# Patient Record
Sex: Male | Born: 1970 | Race: Black or African American | Hispanic: No | Marital: Single | Smoking: Former smoker
Health system: Southern US, Community
[De-identification: ages and names within clinical notes are randomized; demographics above are authoritative.]

## PROBLEM LIST (undated history)

## (undated) DIAGNOSIS — C61 Malignant neoplasm of prostate: Secondary | ICD-10-CM

## (undated) DIAGNOSIS — Z21 Asymptomatic human immunodeficiency virus [HIV] infection status: Secondary | ICD-10-CM

## (undated) DIAGNOSIS — E785 Hyperlipidemia, unspecified: Secondary | ICD-10-CM

## (undated) DIAGNOSIS — B20 Human immunodeficiency virus [HIV] disease: Secondary | ICD-10-CM

## (undated) DIAGNOSIS — I10 Essential (primary) hypertension: Secondary | ICD-10-CM

## (undated) HISTORY — DX: Hyperlipidemia, unspecified: E78.5

## (undated) HISTORY — DX: Asymptomatic human immunodeficiency virus (hiv) infection status: Z21

## (undated) HISTORY — DX: Human immunodeficiency virus (HIV) disease: B20

## (undated) HISTORY — DX: Malignant neoplasm of prostate: C61

## (undated) HISTORY — DX: Essential (primary) hypertension: I10

## (undated) HISTORY — PX: NO PAST SURGERIES: SHX2092

---

## 2014-06-17 DIAGNOSIS — R7612 Nonspecific reaction to cell mediated immunity measurement of gamma interferon antigen response without active tuberculosis: Secondary | ICD-10-CM | POA: Insufficient documentation

## 2014-06-17 DIAGNOSIS — Z227 Latent tuberculosis: Secondary | ICD-10-CM

## 2014-06-17 HISTORY — DX: Latent tuberculosis: Z22.7

## 2014-12-14 DIAGNOSIS — E782 Mixed hyperlipidemia: Secondary | ICD-10-CM | POA: Insufficient documentation

## 2014-12-14 DIAGNOSIS — C61 Malignant neoplasm of prostate: Secondary | ICD-10-CM | POA: Insufficient documentation

## 2014-12-14 DIAGNOSIS — Z8042 Family history of malignant neoplasm of prostate: Secondary | ICD-10-CM | POA: Insufficient documentation

## 2014-12-14 DIAGNOSIS — K219 Gastro-esophageal reflux disease without esophagitis: Secondary | ICD-10-CM | POA: Insufficient documentation

## 2016-08-30 DIAGNOSIS — Z79899 Other long term (current) drug therapy: Secondary | ICD-10-CM | POA: Insufficient documentation

## 2016-08-31 DIAGNOSIS — Z23 Encounter for immunization: Secondary | ICD-10-CM | POA: Insufficient documentation

## 2017-05-27 DIAGNOSIS — Z566 Other physical and mental strain related to work: Secondary | ICD-10-CM | POA: Insufficient documentation

## 2017-05-27 DIAGNOSIS — E663 Overweight: Secondary | ICD-10-CM | POA: Insufficient documentation

## 2017-05-27 DIAGNOSIS — K029 Dental caries, unspecified: Secondary | ICD-10-CM | POA: Insufficient documentation

## 2019-06-16 DIAGNOSIS — Z21 Asymptomatic human immunodeficiency virus [HIV] infection status: Secondary | ICD-10-CM | POA: Insufficient documentation

## 2019-06-16 DIAGNOSIS — B20 Human immunodeficiency virus [HIV] disease: Secondary | ICD-10-CM | POA: Insufficient documentation

## 2019-06-16 DIAGNOSIS — I10 Essential (primary) hypertension: Secondary | ICD-10-CM | POA: Insufficient documentation

## 2020-03-07 ENCOUNTER — Telehealth: Payer: Self-pay | Admitting: *Deleted

## 2020-03-07 NOTE — Telephone Encounter (Signed)
Referring provider calling about referral status. RN relayed that we have attempted to contact patient to schedule, left messages. Tye [place] will call patient today and give him our number so he can call back to schedule TRANSFER B20 appointment.   Landis Gandy, RN

## 2020-07-06 ENCOUNTER — Other Ambulatory Visit (HOSPITAL_COMMUNITY): Payer: Self-pay

## 2020-07-06 ENCOUNTER — Telehealth: Payer: Self-pay

## 2020-07-06 NOTE — Telephone Encounter (Signed)
[facility] Patient Teacher, English as a foreign language completed.    The patient is insured through [employer]administrative officer) and has a $1540.00 copay.   Patient will need a Copay coupon card to make his copay $0.00.     We will continue to follow to see if copay assistance is needed.  Ileene Patrick, [facility] Patient [facility] Phone: [phone] Fax:  [phone]

## 2020-07-07 ENCOUNTER — Other Ambulatory Visit: Payer: Self-pay

## 2020-07-07 DIAGNOSIS — Z79899 Other long term (current) drug therapy: Secondary | ICD-10-CM

## 2020-07-07 DIAGNOSIS — Z113 Encounter for screening for infections with a predominantly sexual mode of transmission: Secondary | ICD-10-CM

## 2020-07-07 DIAGNOSIS — B20 Human immunodeficiency virus [HIV] disease: Secondary | ICD-10-CM

## 2020-07-11 ENCOUNTER — Other Ambulatory Visit: Payer: No Typology Code available for payment source

## 2020-07-11 ENCOUNTER — Ambulatory Visit: Payer: No Typology Code available for payment source

## 2020-07-11 ENCOUNTER — Other Ambulatory Visit: Payer: Self-pay

## 2020-07-11 ENCOUNTER — Other Ambulatory Visit (HOSPITAL_COMMUNITY)
Admission: RE | Admit: 2020-07-11 | Discharge: 2020-07-11 | Disposition: A | Discharge status: Home / Self Care | Payer: No Typology Code available for payment source | Source: Ambulatory Visit | Attending: Infectious Diseases | Admitting: Infectious Diseases

## 2020-07-11 DIAGNOSIS — B20 Human immunodeficiency virus [HIV] disease: Secondary | ICD-10-CM | POA: Insufficient documentation

## 2020-07-11 DIAGNOSIS — Z79899 Other long term (current) drug therapy: Secondary | ICD-10-CM

## 2020-07-11 DIAGNOSIS — Z113 Encounter for screening for infections with a predominantly sexual mode of transmission: Secondary | ICD-10-CM | POA: Insufficient documentation

## 2020-07-12 LAB — URINALYSIS
Bilirubin Urine: NEGATIVE
Glucose, UA: NEGATIVE
Hgb urine dipstick: NEGATIVE
Ketones, ur: NEGATIVE
Leukocytes,Ua: NEGATIVE
Nitrite: NEGATIVE
Protein, ur: NEGATIVE
Specific Gravity, Urine: 1.023 (ref 1.001–1.035)
pH: 7 (ref 5.0–8.0)

## 2020-07-12 LAB — URINE CYTOLOGY ANCILLARY ONLY
Chlamydia: NEGATIVE
Comment: NEGATIVE
Comment: NORMAL
Neisseria Gonorrhea: NEGATIVE

## 2020-07-12 LAB — T-HELPER CELL (CD4) - (RCID CLINIC ONLY)
CD4 % Helper T Cell: 50 % (ref 33–65)
CD4 T Cell Abs: 702 /uL (ref 400–1790)

## 2020-07-13 NOTE — Progress Notes (Signed)
Quantiferon +  Previously in care at [facility], will look through previous records to see if he has had tx for this in the past. Will discuss at upcoming [place] in May

## 2020-07-25 ENCOUNTER — Ambulatory Visit
Admission: RE | Admit: 2020-07-25 | Discharge: 2020-07-25 | Disposition: A | Discharge status: Home / Self Care | Payer: No Typology Code available for payment source | Source: Ambulatory Visit | Attending: Infectious Diseases | Admitting: Infectious Diseases

## 2020-07-25 ENCOUNTER — Ambulatory Visit: Payer: Self-pay | Admitting: Pharmacist

## 2020-07-25 ENCOUNTER — Encounter: Payer: Self-pay | Admitting: Infectious Diseases

## 2020-07-25 ENCOUNTER — Other Ambulatory Visit: Payer: Self-pay

## 2020-07-25 ENCOUNTER — Ambulatory Visit: Payer: No Typology Code available for payment source | Admitting: Infectious Diseases

## 2020-07-25 VITALS — BP 134/84 | HR 76 | Wt 209.0 lb

## 2020-07-25 DIAGNOSIS — B20 Human immunodeficiency virus [HIV] disease: Secondary | ICD-10-CM | POA: Diagnosis not present

## 2020-07-25 DIAGNOSIS — C61 Malignant neoplasm of prostate: Secondary | ICD-10-CM | POA: Diagnosis not present

## 2020-07-25 DIAGNOSIS — R7612 Nonspecific reaction to cell mediated immunity measurement of gamma interferon antigen response without active tuberculosis: Secondary | ICD-10-CM

## 2020-07-25 DIAGNOSIS — A159 Respiratory tuberculosis unspecified: Secondary | ICD-10-CM

## 2020-07-25 MED ORDER — GENVOYA 150-150-200-10 MG PO TABS: ORAL_TABLET | ORAL | 5 refills | Status: DC

## 2020-07-25 NOTE — Patient Instructions (Signed)
Please stop by [facility] on your way out for a quick chest x ray. No appointment needed. If you cannot today we can do this at another visit.   Please send [place] a photo of your COVID vaccine card so we can update your chart.   Will see you back in 6 months.   We can do blood work prior to your visit or at the visit same day - depends on what you prefer.

## 2020-07-25 NOTE — Progress Notes (Signed)
Name: Jared [name]  DOB: 1970-10-20 MRN: [id number] PCP: Doreatha Lew, MD      Brief Narrative:  Jared [name] is a 50 y.o. male with well controlled HIV disease, Dx in [place] in 2007.  CD4 nadir > 200.  HIV Risk: sexual (MSM/heterosexual) History of OIs: none known Intake Labs 06/2020: Hep B sAg (-), sAb (+), cAb (-); Hep A (+), Hep C (-) Quantiferon (+ --> s/p LTBI tx 2016 28m isoniazid) HLA B*5701 (-) G6PD: () Toxo IgG 2016: (-)   Previous Regimens: . 2007 - Norvir, Epzicom (two other medication with this but cannot recall) . Stribild  . genvoya    Genotypes: . 2016: "on file with [facility] but not available to see"  Subjective:   Chief Complaint  Patient presents with  . New Patient (Initial Visit)    Declined condoms;      HPI: Jared [name] is a 50 y.o. male is here today for his first visit to transfer care for HIV disease.   Previously in care with [facility] (On Genvoya, last office visit appears to have been Feb 2021 with undetectable viral load at that time and CD4 596). He states he was diagnosed around 2007 and had a very unpleasant pill regimen at first. Switched to [place] a few years ago and states he has very good adherence with this. Misses rare doses if something comes up unexpectedly but would estimate this < 1x monthly if that. No trouble with side effects or access to medication. Would like to be a father someday. No current partners, enjoys male and male historically. Has a PCP through [facility] he works with for other medical conditions. Working with [facility] re: adenocarcinoma of prostate. Eating and sleeping well. No significant concerns over anxious or depressed mood.     Review of Systems  Constitutional: Negative for chills, fever, malaise/fatigue and weight loss.  HENT: Negative for sore throat.        No dental problems  Respiratory: Negative for cough and sputum production.   Cardiovascular: Negative  for chest pain and leg swelling.  Gastrointestinal: Negative for abdominal pain, diarrhea and vomiting.  Genitourinary: Negative for dysuria and flank pain.  Musculoskeletal: Negative for joint pain, myalgias and neck pain.  Skin: Negative for rash.  Neurological: Negative for dizziness, tingling and headaches.  Psychiatric/Behavioral: Negative for depression and substance abuse. The patient is not nervous/anxious and does not have insomnia.     Past Medical History:  Diagnosis Date  . HIV infection ([place])   . Hyperlipidemia   . Hypertension   . 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 Received daily INH for 8.5 months Formatting of this note might be different from the original. Received daily INH for 8.5 months Received daily INH for 8.5 months Formatting of this note might be different from the original. Received daily INH for 8.5 months  . Prostate cancer [facility])     Outpatient Medications Prior to Visit  Medication Sig Dispense Refill  . amLODipine (NORVASC) 5 MG tablet Take 1 tablet by mouth daily.    Marland Kitchen ascorbic acid (VITAMIN C) 1000 MG tablet Vitamin C    . Multiple Vitamin (MULTI-VITAMIN) tablet Take by mouth.    Marland Kitchen omeprazole (PRILOSEC) 20 MG capsule omeprazole 20 mg capsule,delayed release  TAKE 1 CAPSULE BY MOUTH EVERY DAY    . valACYclovir (VALTREX) 500 MG tablet Take 500 mg by mouth 2 (  two) times daily.    Marland Kitchen elvitegravir-cobicistat-emtricitabine-tenofovir (GENVOYA) 150-150-200-10 MG TABS tablet TAKE ONE TABLET BY MOUTH ONCE DAILY WITH FOOD. STORE IN ORIGINAL CONTAINER AT ROOM TEMPERATURE.     No facility-administered medications prior to visit.     Allergies  Allergen Reactions  . No Known Allergies     Social History   Tobacco Use  . Smoking status: Former Research scientist (life sciences)  . Smokeless tobacco: Never Used  . Tobacco comment: passive, when a kid, not habitual  Substance Use Topics  . Alcohol use: Not Currently   . Drug use: Not Currently    Family History  Problem Relation Age of Onset  . Heart failure Mother        amyloidosis   . Diabetes Mother   . Hypertension Mother   . Diabetes Father   . Other Father        cardiac arrest   . Heart failure Maternal Grandmother        pacemaker   . Melanoma Maternal Grandfather   . Diabetes Paternal Grandmother   . Heart disease Paternal Grandmother     Social History   Substance and Sexual Activity  Sexual Activity Not Currently  . Partners: Male, Male     Objective:   Vitals:   07/25/20 1357  BP: 134/84  Pulse: 76  Weight: 209 lb (94.8 kg)   There is no height or weight on file to calculate BMI.   Physical Exam Vitals reviewed.  Constitutional:      Appearance: Normal appearance. He is not ill-appearing.  HENT:     Nose: No congestion.  Eyes:     General: No scleral icterus.    Pupils: Pupils are equal, round, and reactive to light.  Cardiovascular:     Rate and Rhythm: Normal rate and regular rhythm.     Heart sounds: No murmur heard.   Pulmonary:     Effort: Pulmonary effort is normal.     Breath sounds: Normal breath sounds.  Abdominal:     General: There is no distension.     Palpations: Abdomen is soft.  Musculoskeletal:        General: Normal range of motion.     Cervical back: Normal range of motion.  Skin:    General: Skin is warm and dry.     Capillary Refill: Capillary refill takes less than 2 seconds.  Neurological:     Mental Status: He is alert and oriented to person, place, and time.     Lab Results Lab Results  Component Value Date   WBC 4.4 07/11/2020   HGB 14.9 07/11/2020   HCT 44.7 07/11/2020   MCV 88.2 07/11/2020   PLT 249 07/11/2020    Lab Results  Component Value Date   CREATININE 1.30 07/11/2020   BUN 11 07/11/2020   NA 138 07/11/2020   K 4.2 07/11/2020   CL 103 07/11/2020   CO2 30 07/11/2020    Lab Results  Component Value Date   ALT 11 07/11/2020   AST 14 07/11/2020    BILITOT 0.5 07/11/2020    Lab Results  Component Value Date   CHOL 153 07/11/2020   HDL 58 07/11/2020   LDLCALC 76 07/11/2020   TRIG 105 07/11/2020   CHOLHDL 2.6 07/11/2020   HIV 1 RNA Quant (copies/mL)  Date Value  07/11/2020 NOT DETECTED   CD4 T Cell Abs (/uL)  Date Value  07/11/2020 702     Assessment & Plan:   Problem List  Items Addressed This Visit      Unprioritized   Human immunodeficiency virus (HIV) disease ([place]) - Primary    New patient here to establish for HIV care.   [facility] in care with [facility] on Genvoya once daily with well-controlled HIV and healthy recovery of CD4 > 500 cells. His adherence is very good and there are no drug interactions identified that are significant. Will provide refills for him today. 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   I discussed with Bobetta Lime treatment options/side effects, benefits of treatment and long-term outcomes. I discussed how HIV is transmitted and the process of untreated HIV including increased risk for opportunistic infections, cancer, dementia and renal failure. Patient was counseled on routine HIV care including medication adherence, blood monitoring, necessary vaccines and follow up visits. Counseled regarding safe sex practices including: condom use, partner disclosure, limiting partners.  General introduction to our clinic and integrated services.  I spent greater than 45 minutes with the patient today. Greater than 50% of the time spent face-to-face counseling and coordination of care re: HIV and health maintenance.        Relevant Medications   valACYclovir (VALTREX) 500 MG tablet   elvitegravir-cobicistat-emtricitabine-tenofovir (GENVOYA) 150-150-200-10 MG TABS tablet   Other Relevant Orders   HIV-1 RNA quant-no reflex-bld   T-helper cell (CD4)- ([facility] clinic only)    History of Latent TB, s/p treatment 2016    S/P recommended treatment with Isoniazid in 2016. Will order baseline chest xray. Discussed he needs no further treatment and risk for reactivation of disease is extremely low given his treatment regimen.       Relevant Orders   DG Chest 2 View   Adenocarcinoma of prostate [facility])    Currently working with [facility]       Relevant Medications   valACYclovir (VALTREX) 500 MG tablet   elvitegravir-cobicistat-emtricitabine-tenofovir (GENVOYA) 150-150-200-10 MG TABS tablet      Janene Madeira, MSN, NP-C [facility] [place] Pager: [phone] Office: [phone]  07/26/20  9:42 AM

## 2020-07-26 ENCOUNTER — Encounter: Payer: Self-pay | Admitting: Infectious Diseases

## 2020-07-26 NOTE — Assessment & Plan Note (Signed)
New patient here to establish for HIV care.   Previously in care with [facility] on Genvoya once daily with well-controlled HIV and healthy recovery of CD4 > 500 cells. His adherence is very good and there are no drug interactions identified that are significant. Will provide refills for him today. 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   I discussed with Bobetta Lime treatment options/side effects, benefits of treatment and long-term outcomes. I discussed how HIV is transmitted and the process of untreated HIV including increased risk for opportunistic infections, cancer, dementia and renal failure. Patient was counseled on routine HIV care including medication adherence, blood monitoring, necessary vaccines and follow up visits. Counseled regarding safe sex practices including: condom use, partner disclosure, limiting partners.  General introduction to our clinic and integrated services.  I spent greater than 45 minutes with the patient today. Greater than 50% of the time spent face-to-face counseling and coordination of care re: HIV and health maintenance.

## 2020-07-26 NOTE — Assessment & Plan Note (Signed)
S/P recommended treatment with Isoniazid in 2016. Will order baseline chest xray. Discussed he needs no further treatment and risk for reactivation of disease is extremely low given his treatment regimen.

## 2020-07-26 NOTE — Assessment & Plan Note (Signed)
Currently working with [facility]

## 2020-07-28 LAB — HEPATITIS A ANTIBODY, TOTAL: Hepatitis A AB,Total: REACTIVE — AB

## 2020-07-28 LAB — CBC WITH DIFFERENTIAL/PLATELET
Absolute Monocytes: 585 cells/uL (ref 200–950)
Basophils Absolute: 31 cells/uL (ref 0–200)
Basophils Relative: 0.7 %
Eosinophils Absolute: 92 cells/uL (ref 15–500)
Eosinophils Relative: 2.1 %
HCT: 44.7 % (ref 38.5–50.0)
Hemoglobin: 14.9 g/dL (ref 13.2–17.1)
Lymphs Abs: 1373 cells/uL (ref 850–3900)
MCH: 29.4 pg (ref 27.0–33.0)
MCHC: 33.3 g/dL (ref 32.0–36.0)
MCV: 88.2 fL (ref 80.0–100.0)
MPV: 11.4 fL (ref 7.5–12.5)
Monocytes Relative: 13.3 %
Neutro Abs: 2319 cells/uL (ref 1500–7800)
Neutrophils Relative %: 52.7 %
Platelets: 249 10*3/uL (ref 140–400)
RBC: 5.07 10*6/uL (ref 4.20–5.80)
RDW: 13.8 % (ref 11.0–15.0)
Total Lymphocyte: 31.2 %
WBC: 4.4 10*3/uL (ref 3.8–10.8)

## 2020-07-28 LAB — HEPATITIS B CORE ANTIBODY, TOTAL: Hep B Core Total Ab: NONREACTIVE

## 2020-07-28 LAB — COMPLETE METABOLIC PANEL WITH GFR
AG Ratio: 1.5 (calc) (ref 1.0–2.5)
ALT: 11 U/L (ref 9–46)
AST: 14 U/L (ref 10–35)
Albumin: 4.2 g/dL (ref 3.6–5.1)
Alkaline phosphatase (APISO): 62 U/L (ref 35–144)
BUN: 11 mg/dL (ref 7–25)
CO2: 30 mmol/L (ref 20–32)
Calcium: 9.1 mg/dL (ref 8.6–10.3)
Chloride: 103 mmol/L (ref 98–110)
Creat: 1.3 mg/dL (ref 0.70–1.33)
GFR, Est African American: 73 mL/min/{1.73_m2} (ref >=60)
GFR, Est Non African American: 63 mL/min/{1.73_m2} (ref >=60)
Globulin: 2.8 g/dL (calc) (ref 1.9–3.7)
Glucose, Bld: 95 mg/dL (ref 65–99)
Potassium: 4.2 mmol/L (ref 3.5–5.3)
Sodium: 138 mmol/L (ref 135–146)
Total Bilirubin: 0.5 mg/dL (ref 0.2–1.2)
Total Protein: 7 g/dL (ref 6.1–8.1)

## 2020-07-28 LAB — HEPATITIS B SURFACE ANTIBODY,QUALITATIVE: Hep B S Ab: BORDERLINE — AB

## 2020-07-28 LAB — HIV-1 RNA ULTRAQUANT REFLEX TO GENTYP+
HIV 1 RNA Quant: NOT DETECTED copies/mL
HIV-1 RNA Quant, Log: NOT DETECTED Log copies/mL

## 2020-07-28 LAB — HIV-1/2 AB - DIFFERENTIATION
HIV-1 antibody: POSITIVE — AB
HIV-2 Ab: NEGATIVE

## 2020-07-28 LAB — RPR: RPR Ser Ql: NONREACTIVE

## 2020-07-28 LAB — LIPID PANEL
Cholesterol: 153 mg/dL (ref <200)
HDL: 58 mg/dL (ref >=40)
LDL Cholesterol (Calc): 76 mg/dL (calc)
Non-HDL Cholesterol (Calc): 95 mg/dL (calc) (ref <130)
Total CHOL/HDL Ratio: 2.6 (calc) (ref <5.0)
Triglycerides: 105 mg/dL (ref <150)

## 2020-07-28 LAB — HEPATITIS C ANTIBODY
Hepatitis C Ab: NONREACTIVE
SIGNAL TO CUT-OFF: 0.01 (ref <1.00)

## 2020-07-28 LAB — QUANTIFERON-TB GOLD PLUS
Mitogen-NIL: >10 IU/mL
NIL: 0.19 IU/mL
QuantiFERON-TB Gold Plus: POSITIVE — AB
TB1-NIL: 1.26 IU/mL
TB2-NIL: 1.5 IU/mL

## 2020-07-28 LAB — HLA B*5701: HLA-B*5701 w/rflx HLA-B High: NEGATIVE

## 2020-07-28 LAB — HIV ANTIBODY (ROUTINE TESTING W REFLEX): HIV 1&2 Ab, 4th Generation: REACTIVE — AB

## 2020-07-28 LAB — HEPATITIS B SURFACE ANTIGEN: Hepatitis B Surface Ag: NONREACTIVE

## 2020-08-24 ENCOUNTER — Encounter: Payer: Self-pay | Admitting: Infectious Diseases

## 2021-01-16 ENCOUNTER — Other Ambulatory Visit: Payer: No Typology Code available for payment source

## 2021-01-30 ENCOUNTER — Encounter: Payer: No Typology Code available for payment source | Admitting: Infectious Diseases

## 2021-02-13 ENCOUNTER — Other Ambulatory Visit: Payer: No Typology Code available for payment source

## 2021-02-13 ENCOUNTER — Other Ambulatory Visit: Payer: Self-pay

## 2021-02-13 DIAGNOSIS — B20 Human immunodeficiency virus [HIV] disease: Secondary | ICD-10-CM

## 2021-02-14 LAB — T-HELPER CELL (CD4) - (RCID CLINIC ONLY)
CD4 % Helper T Cell: 43 % (ref 33–65)
CD4 T Cell Abs: 547 /uL (ref 400–1790)

## 2021-02-15 LAB — HIV-1 RNA QUANT-NO REFLEX-BLD
HIV 1 RNA Quant: NOT DETECTED Copies/mL
HIV-1 RNA Quant, Log: NOT DETECTED Log cps/mL

## 2021-02-24 ENCOUNTER — Other Ambulatory Visit: Payer: Self-pay | Admitting: Infectious Diseases

## 2021-02-24 DIAGNOSIS — B20 Human immunodeficiency virus [HIV] disease: Secondary | ICD-10-CM

## 2021-02-24 NOTE — Telephone Encounter (Signed)
Appointment 12/12

## 2021-02-27 ENCOUNTER — Ambulatory Visit (INDEPENDENT_AMBULATORY_CARE_PROVIDER_SITE_OTHER): Payer: No Typology Code available for payment source

## 2021-02-27 ENCOUNTER — Encounter: Payer: Self-pay | Admitting: Infectious Diseases

## 2021-02-27 ENCOUNTER — Ambulatory Visit: Payer: No Typology Code available for payment source | Admitting: Infectious Diseases

## 2021-02-27 ENCOUNTER — Other Ambulatory Visit: Payer: Self-pay

## 2021-02-27 VITALS — BP 115/79 | HR 75 | Temp 98.6°F | Wt 214.0 lb

## 2021-02-27 DIAGNOSIS — Z113 Encounter for screening for infections with a predominantly sexual mode of transmission: Secondary | ICD-10-CM

## 2021-02-27 DIAGNOSIS — Z79899 Other long term (current) drug therapy: Secondary | ICD-10-CM | POA: Diagnosis not present

## 2021-02-27 DIAGNOSIS — Z23 Encounter for immunization: Secondary | ICD-10-CM

## 2021-02-27 DIAGNOSIS — B20 Human immunodeficiency virus [HIV] disease: Secondary | ICD-10-CM | POA: Diagnosis not present

## 2021-02-27 DIAGNOSIS — B001 Herpesviral vesicular dermatitis: Secondary | ICD-10-CM

## 2021-02-27 MED ORDER — GENVOYA 150-150-200-10 MG PO TABS: ORAL_TABLET | ORAL | 11 refills | Status: DC

## 2021-02-27 NOTE — Patient Instructions (Addendum)
For the valtrex do 2 tabs (1 gram) twice a day for 1-2 days if you catch it early - your immune system should be strong enough for this to be effective. If you need to continue for 3-5 days that's fine too.  Let me know if you need any refills via mychart  I sent in refills of your Genvoya. Please continue this once a day.   Will plan to see you back in 1 year with labs prior to your visit please.

## 2021-02-27 NOTE — Progress Notes (Signed)
   Covid-19 Vaccination Clinic  Name:  Jared [name]    MRN: [id number] DOB: [birth year]  02/27/2021  [name] was observed post Covid-19 immunization for 15 minutes without incident. He was provided with Vaccine Information Sheet and instruction to access the V-Safe system.   [name] was instructed to call 911 with any severe reactions post vaccine: Difficulty breathing  Swelling of face and throat  A fast heartbeat  A bad rash all over body  Dizziness and weakness   Immunizations Administered     Name Date Dose VIS Date Route   Pfizer Covid-19 Vaccine Bivalent Booster 02/27/2021  4:26 PM 0.3 mL 11/16/2020 Intramuscular   Manufacturer: [place]   Lot: [id number]   [place]: [place], [place]

## 2021-02-27 NOTE — Progress Notes (Signed)
Name: Jared [name]  DOB: 1970-04-12 MRN: [id number] PCP: Doreatha Lew, MD      Brief Narrative:  Jared [name] is a 50 y.o. male with well controlled HIV disease, Dx in [place] in 2007.  CD4 nadir > 200.  HIV Risk: sexual (MSM/heterosexual) History of OIs: none known Intake Labs 06/2020: Hep B sAg (-), sAb (+), cAb (-); Hep A (+), Hep C (-) Quantiferon (+ --> s/p LTBI tx 2016 71misoniazid) HLA B*5701 (-) G6PD: () Toxo IgG 2016: (-)   Previous Regimens: 2007 - Norvir, Epzicom (two other medication with this but cannot recall) Stribild  genvoya    Genotypes: 2016: "on file with [facility] but not available to see"  Subjective:   Chief Complaint  Patient presents with   Follow-up     HPI: Jared BASTOSis a 50y.o. male is here for 625mU for well controlled HIV.   Underwent removal of lipoma to the scalp in August - path benign.  He has no concerns today. Taking all medications as prescribed and never misses Genvoya. Takes valtrex PRN for cold sores.    Would like Flu and COVID bivalent booster today.   He is eating and sleeping well. No changes or concerns with anxious/depressed mood. Weight stable. No new health conditions. No hospitalizations or ER visits.    Review of Systems  Constitutional:  Negative for chills, fever, malaise/fatigue and weight loss.  HENT:  Negative for sore throat.        No dental problems  Respiratory:  Negative for cough and sputum production.   Cardiovascular:  Negative for chest pain and leg swelling.  Gastrointestinal:  Negative for abdominal pain, diarrhea and vomiting.  Genitourinary:  Negative for dysuria and flank pain.  Musculoskeletal:  Negative for joint pain, myalgias and neck pain.  Skin:  Negative for rash.  Neurological:  Negative for dizziness, tingling and headaches.  Psychiatric/Behavioral:  Negative for depression and substance abuse. The patient is not nervous/anxious and does not have  insomnia.    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 Received daily INH for 8.5 months Formatting of this note might be different from the original. Received daily INH for 8.5 months Received daily INH for 8.5 months Formatting of this note might be different from the original. Received daily INH for 8.5 months   [facility]    Outpatient Medications Prior to Visit  Medication Sig Dispense Refill   amLODipine (NORVASC) 5 MG tablet Take 1 tablet by mouth daily.     ascorbic acid (VITAMIN C) 1000 MG tablet Vitamin C     Multiple Vitamin (MULTI-VITAMIN) tablet Take by mouth.     omeprazole (PRILOSEC) 20 MG capsule omeprazole 20 mg capsule,delayed release  TAKE 1 CAPSULE BY MOUTH EVERY DAY     valACYclovir (VALTREX) 500 MG tablet Take 500 mg by mouth 2 (two) times daily.     elvitegravir-cobicistat-emtricitabine-tenofovir (GENVOYA) 150-150-200-10 MG TABS tablet TAKE ONE TABLET BY MOUTH ONCE DAILY WITH FOOD. STORE IN ORIGINAL CONTAINER AT ROOM TEMPERATURE. 30 tablet 5   No facility-administered medications prior to visit.     Allergies  Allergen Reactions   No Known Allergies     Social History   Tobacco Use   Smoking status: Former   Smokeless tobacco:  Never   Tobacco comments:    passive, when a kid, not habitual  Substance Use Topics   Alcohol use: Not Currently   Drug use: Not Currently    Family History  Problem Relation Age of Onset   Heart failure Mother        amyloidosis    Diabetes Mother    Hypertension Mother    Diabetes Father    Other Father        cardiac arrest    Heart failure Maternal Grandmother        pacemaker    Melanoma Maternal Grandfather    Diabetes Paternal Grandmother    Heart disease Paternal Grandmother     Social History   Substance and Sexual Activity  Sexual  Activity Not Currently   Partners: Male, Male     Objective:   Vitals:   02/27/21 1556  BP: 115/79  Pulse: 75  Temp: 98.6 F (37 C)  TempSrc: Oral  SpO2: 97%  Weight: 214 lb (97.1 kg)   There is no height or weight on file to calculate BMI.   Physical Exam Vitals reviewed.  Constitutional:      Appearance: Normal appearance. He is not ill-appearing.  HENT:     Nose: No congestion.  Eyes:     General: No scleral icterus.    Pupils: Pupils are equal, round, and reactive to light.  Cardiovascular:     Rate and Rhythm: Normal rate and regular rhythm.     Heart sounds: No murmur heard. Pulmonary:     Effort: Pulmonary effort is normal.     Breath sounds: Normal breath sounds.  Abdominal:     General: There is no distension.     Palpations: Abdomen is soft.  Musculoskeletal:        General: Normal range of motion.     Cervical back: Normal range of motion.  Skin:    General: Skin is warm and dry.     Capillary Refill: Capillary refill takes less than 2 seconds.  Neurological:     Mental Status: He is alert and oriented to person, place, and time.    Lab Results Lab Results  Component Value Date   WBC 4.4 07/11/2020   HGB 14.9 07/11/2020   HCT 44.7 07/11/2020   MCV 88.2 07/11/2020   PLT 249 07/11/2020    Lab Results  Component Value Date   CREATININE 1.30 07/11/2020   BUN 11 07/11/2020   NA 138 07/11/2020   K 4.2 07/11/2020   CL 103 07/11/2020   CO2 30 07/11/2020    Lab Results  Component Value Date   ALT 11 07/11/2020   AST 14 07/11/2020   BILITOT 0.5 07/11/2020    Lab Results  Component Value Date   CHOL 153 07/11/2020   HDL 58 07/11/2020   LDLCALC 76 07/11/2020   TRIG 105 07/11/2020   CHOLHDL 2.6 07/11/2020   HIV 1 RNA Quant  Date Value  02/13/2021 Not Detected Copies/mL  07/11/2020 NOT DETECTED copies/mL   CD4 T Cell Abs (/uL)  Date Value  02/13/2021 547  07/11/2020 702     Assessment & Plan:   Problem List Items Addressed  This Visit       Unprioritized   Encounter for long-term (current) use of medications - Primary   Relevant Orders   Lipid panel   Human immunodeficiency virus (HIV) disease ([place])    Well controlled on Genvoya. No DDI's identified. Will provide refills, update pertinent  labs and have him return in 1 year.  Vaccines updated with flu and COVID bivalent.  No dental or mental health needs.  STI screening politely declined.  Has a PCP he sees regularly for health maintenance and prevention.       Relevant Medications   elvitegravir-cobicistat-emtricitabine-tenofovir (GENVOYA) 150-150-200-10 MG TABS tablet   Other Relevant Orders   CBC   Comprehensive metabolic panel   T-helper cell (CD4)- ([facility] clinic only)   Lipid panel   HIV-1 RNA quant-no reflex-bld   RPR   Recurrent cold sores    His immune system is strong enough to handle typical dose intervention. Valtrex 1g BID x 2 days if caught early. Otherwise same dose 3-5 days. Can have refills when he needs them.       Relevant Medications   elvitegravir-cobicistat-emtricitabine-tenofovir (GENVOYA) 150-150-200-10 MG TABS tablet   Other Visit Diagnoses     Screening examination for venereal disease       Relevant Orders   RPR   Need for immunization against influenza       Relevant Orders   Flu Vaccine QUAD 53moIM (Fluarix, Fluzone & Alfiuria Quad PF) (Completed)      SJanene Madeira MSN, NP-C [facility] DRock HillPager: [phone]: [phone] 03/05/21  8:31 PM

## 2021-03-05 ENCOUNTER — Encounter: Payer: Self-pay | Admitting: Infectious Diseases

## 2021-03-05 DIAGNOSIS — B001 Herpesviral vesicular dermatitis: Secondary | ICD-10-CM | POA: Insufficient documentation

## 2021-03-05 NOTE — Assessment & Plan Note (Signed)
His immune system is strong enough to handle typical dose intervention. Valtrex 1g BID x 2 days if caught early. Otherwise same dose 3-5 days. Can have refills when he needs them.

## 2021-03-05 NOTE — Assessment & Plan Note (Addendum)
Well controlled on Genvoya. No DDI's identified. Will provide refills, update pertinent labs and have him return in 1 year.  Vaccines updated with flu and COVID bivalent.  No dental or mental health needs.  STI screening politely declined.  Has a PCP he sees regularly for health maintenance and prevention.

## 2021-11-08 ENCOUNTER — Other Ambulatory Visit (HOSPITAL_COMMUNITY): Payer: Self-pay

## 2021-11-08 ENCOUNTER — Telehealth: Payer: Self-pay

## 2021-11-08 NOTE — Telephone Encounter (Signed)
Patient called stating that he has been out his [place] since June. Patient was unsure if his copay card had expired. I reached out to Butch Penny to assist patient and she has contacted [facility] and will reach out to the patient regarding medication.   [place], CMA

## 2022-02-26 ENCOUNTER — Ambulatory Visit (INDEPENDENT_AMBULATORY_CARE_PROVIDER_SITE_OTHER): Payer: No Typology Code available for payment source

## 2022-02-26 ENCOUNTER — Other Ambulatory Visit: Payer: Self-pay

## 2022-02-26 ENCOUNTER — Other Ambulatory Visit (HOSPITAL_COMMUNITY): Payer: Self-pay

## 2022-02-26 ENCOUNTER — Encounter: Payer: Self-pay | Admitting: Infectious Diseases

## 2022-02-26 ENCOUNTER — Ambulatory Visit (INDEPENDENT_AMBULATORY_CARE_PROVIDER_SITE_OTHER): Payer: No Typology Code available for payment source | Admitting: Infectious Diseases

## 2022-02-26 VITALS — BP 122/78 | HR 78 | Resp 16 | Ht 74.0 in | Wt 208.0 lb

## 2022-02-26 DIAGNOSIS — B20 Human immunodeficiency virus [HIV] disease: Secondary | ICD-10-CM

## 2022-02-26 DIAGNOSIS — Z23 Encounter for immunization: Secondary | ICD-10-CM

## 2022-02-26 MED ORDER — DOVATO 50-300 MG PO TABS
1.0000 | ORAL_TABLET | Freq: Every day | ORAL | 11 refills | Status: DC
  Filled 2022-02-26: qty 30, 30d supply, fill #0

## 2022-02-26 MED ORDER — DOVATO 50-300 MG PO TABS: 1.0000 | ORAL_TABLET | Freq: Every day | ORAL | 11 refills | Status: DC

## 2022-02-26 NOTE — Patient Instructions (Addendum)
Going to send your Dovato to [employer] to fill this month. From what we can see you should still have coverage there.   Will work on making sure that you have ability to refill with coverage next year.   Lets get you back in 2 months so we can make sure you have what you need for your medication and that it is working for you.   Stop by the lab today.   If you have any problems further please send [place] another mychart message. We do carry samples of dovato in clinic as well to help bridge to more durable solution to get you your mediation.

## 2022-02-26 NOTE — Progress Notes (Signed)
Name: Jared [name]  DOB: 06/22/1970 MRN: [id number] PCP: Doreatha Lew, MD      Brief Narrative:  Jared [name] is a 51 y.o. male with well controlled HIV disease, Dx in [place] in 2007.  CD4 nadir > 200.  HIV Risk: sexual (MSM/heterosexual) History of OIs: none known Intake Labs 06/2020: Hep B sAg (-), sAb (+), cAb (-); Hep A (+), Hep C (-) Quantiferon (+ --> s/p LTBI tx 2016 31misoniazid) HLA B*5701 (-) G6PD: () Toxo IgG 2016: (-)   Previous Regimens: 2007 - Norvir, Epzicom (two other medication with this but cannot recall) Stribild  genvoya    Genotypes: 2016: "on file with [facility] but not available to see"  Subjective:   Chief Complaint  Patient presents with  . Follow-up     HPI: Jared DUTKOis a 51y.o. male is here for 620mU for well controlled HIV.   Has been without his medication since about June of this year. Has had some difficulties with patient assistance and affording his Genvoya.   Trouble with some chest "heaviness" recently. Has seasonal trouble with sinuses this time of year that responds well to flonase. Never has tried any allergy pills. No fever, chills, productive cough but occasionally hears some wheezing sounds at quiet rest.   He is eating and sleeping well. No changes or concerns with anxious/depressed mood. Weight stable. No new health conditions or medications. No hospitalizations or ER visits.    Review of Systems  Constitutional:  Negative for chills, fever, malaise/fatigue and weight loss.  HENT:  Negative for sore throat.        No dental problems  Respiratory:  Negative for cough and sputum production.   Cardiovascular:  Negative for chest pain and leg swelling.  Gastrointestinal:  Negative for abdominal pain, diarrhea and vomiting.  Genitourinary:  Negative for dysuria and flank pain.  Musculoskeletal:  Negative for joint pain, myalgias and neck pain.  Skin:  Negative for rash.  Neurological:   Negative for dizziness, tingling and headaches.  Psychiatric/Behavioral:  Negative for depression and substance abuse. The patient is not nervous/anxious and does not have insomnia.     Past Medical History:  Diagnosis Date  . HIV infection ([place]  . Hyperlipidemia   . Hypertension   . 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 Received daily INH for 8.5 months Formatting of this note might be different from the original. Received daily INH for 8.5 months Received daily INH for 8.5 months Formatting of this note might be different from the original. Received daily INH for 8.5 months  [facility]    Outpatient Medications Prior to Visit  Medication Sig Dispense Refill  . amLODipine (NORVASC) 5 MG tablet Take 1 tablet by mouth daily.    . Marland Kitchenscorbic acid (VITAMIN C) 1000 MG tablet Vitamin C    . elvitegravir-cobicistat-emtricitabine-tenofovir (GENVOYA) 150-150-200-10 MG TABS tablet TAKE ONE TABLET BY MOUTH ONCE DAILY WITH FOOD. STORE IN ORIGINAL CONTAINER AT ROOM TEMPERATURE. 30 tablet 11  . Multiple Vitamin (MULTI-VITAMIN) tablet Take by mouth.    . Marland Kitchenmeprazole (PRILOSEC) 20 MG capsule omeprazole 20 mg capsule,delayed release  TAKE 1 CAPSULE BY MOUTH EVERY DAY    . valACYclovir (VALTREX) 500 MG tablet Take 500 mg by mouth 2 (two) times daily.     No facility-administered medications prior to visit.     Allergies  Allergen Reactions  .  No Known Allergies     Social History   Tobacco Use  . Smoking status: Former  . Smokeless tobacco: Never  . Tobacco comments:    passive, when a kid, not habitual  Substance Use Topics  . Alcohol use: Not Currently  . Drug use: Not Currently    Family History  Problem Relation Age of Onset  . Heart failure Mother        amyloidosis   . Diabetes Mother   . Hypertension Mother   . Diabetes Father   . Other Father        cardiac arrest   . Heart failure Maternal  Grandmother        pacemaker   . Melanoma Maternal Grandfather   . Diabetes Paternal Grandmother   . Heart disease Paternal Grandmother     Social History   Substance and Sexual Activity  Sexual Activity Not Currently  . Partners: Male, Male     Objective:   Vitals:   02/26/22 1541  BP: 122/78  Pulse: 78  Resp: 16  SpO2: 97%  Weight: 208 lb (94.3 kg)  Height: _0  (1.88 m)   Body mass index is 26.71 kg/m.   Physical Exam Vitals reviewed.  Constitutional:      Appearance: Normal appearance. He is not ill-appearing.  HENT:     Nose: No congestion.  Eyes:     General: No scleral icterus.    Pupils: Pupils are equal, round, and reactive to light.  Cardiovascular:     Rate and Rhythm: Normal rate and regular rhythm.     Heart sounds: No murmur heard. Pulmonary:     Effort: Pulmonary effort is normal.     Breath sounds: Normal breath sounds.  Abdominal:     General: There is no distension.     Palpations: Abdomen is soft.  Musculoskeletal:        General: Normal range of motion.     Cervical back: Normal range of motion.  Skin:    General: Skin is warm and dry.     Capillary Refill: Capillary refill takes less than 2 seconds.  Neurological:     Mental Status: He is alert and oriented to person, place, and time.    Lab Results Lab Results  Component Value Date   WBC 4.4 07/11/2020   HGB 14.9 07/11/2020   HCT 44.7 07/11/2020   MCV 88.2 07/11/2020   PLT 249 07/11/2020    Lab Results  Component Value Date   CREATININE 1.30 07/11/2020   BUN 11 07/11/2020   NA 138 07/11/2020   K 4.2 07/11/2020   CL 103 07/11/2020   CO2 30 07/11/2020    Lab Results  Component Value Date   ALT 11 07/11/2020   AST 14 07/11/2020   BILITOT 0.5 07/11/2020    Lab Results  Component Value Date   CHOL 153 07/11/2020   HDL 58 07/11/2020   LDLCALC 76 07/11/2020   TRIG 105 07/11/2020   CHOLHDL 2.6 07/11/2020   HIV 1 RNA Quant  Date Value  02/13/2021 Not Detected  Copies/mL  07/11/2020 NOT DETECTED copies/mL   CD4 T Cell Abs (/uL)  Date Value  02/13/2021 547  07/11/2020 702     Assessment & Plan:   Problem List Items Addressed This Visit   None Janene Madeira, MSN, NP-C [facility] [place] Pager: [phone] Office: [phone]  02/26/22  3:46 PM

## 2022-02-27 ENCOUNTER — Other Ambulatory Visit (HOSPITAL_COMMUNITY): Payer: Self-pay

## 2022-02-27 LAB — T-HELPER CELLS (CD4) COUNT (NOT AT ARMC)
CD4 % Helper T Cell: 28 % — ABNORMAL LOW (ref 33–65)
CD4 T Cell Abs: 348 /uL — ABNORMAL LOW (ref 400–1790)

## 2022-02-27 NOTE — Assessment & Plan Note (Signed)
Very well controlled on once daily Genvoya with food; however has had difficulty with accessing medication. I think from what I can discern, the patient assistance cap was reached for [facility] program. We discussed options to switch to alternative today. I think Dovato would be a good option for him to limit potential long term side effects by removing TAF, removes drug interaction with flonase which he uses routinely/seasonally and allows flexibility to be taken without food. He agrees. I gave him 4 weeks of samples today and sent rx to [facility] after discussing with pharmacy. I asked him to please make sure we help him to resolve any issues going forward. Needs Viiv patient assistance for copay likely in 2024. Will check genotype today to ensure no concern but given his medication history I suspect all will be fine.  He is immune to hepatitis b and negative sAg.  No changes to insurance coverage.  No dental needs today.  No concern over anxious/depressed mood.  Sexual health and family planning discussed - no needs identified.  Vaccines updated today - see health maintenance section.   Return in about 2 months (around 04/29/2022).

## 2022-02-27 NOTE — Assessment & Plan Note (Signed)
Flu and covid booster today. Otherwise up to date for age at the moment. We briefly discussed RSV vaccine - he does not seem to be high risk for this, however and would be comfortable deferring this for him.

## 2022-03-01 ENCOUNTER — Other Ambulatory Visit: Payer: Self-pay | Admitting: Pharmacist

## 2022-03-01 DIAGNOSIS — B20 Human immunodeficiency virus [HIV] disease: Secondary | ICD-10-CM

## 2022-03-01 MED ORDER — DOVATO 50-300 MG PO TABS: 1.0000 | ORAL_TABLET | Freq: Every day | ORAL | 0 refills | Status: AC

## 2022-03-01 NOTE — Progress Notes (Signed)
Medication Samples have been provided to the patient.  Drug name: Dovato        Strength: 50/300 mg         Qty: 28  Tablets (2 bottles) LOT: [id number]   Exp.Date: 4/25  Dosing instructions: Take one tablet by mouth once daily  The patient has been instructed regarding the correct time, dose, and frequency of taking this medication, including desired effects and most common side effects.   Alfonse Spruce, PharmD, CPP, BCIDP, [place] Clinical Pharmacist Practitioner Infectious [place] for Infectious Disease

## 2022-03-07 LAB — HIV-1 GENOTYPING (RTI,PI,IN INHBTR): HIV-1 Genotype: DETECTED — AB

## 2022-03-08 ENCOUNTER — Other Ambulatory Visit (HOSPITAL_COMMUNITY): Payer: Self-pay

## 2022-03-23 ENCOUNTER — Other Ambulatory Visit (HOSPITAL_COMMUNITY): Payer: Self-pay

## 2022-03-26 ENCOUNTER — Other Ambulatory Visit (HOSPITAL_COMMUNITY): Payer: Self-pay

## 2022-04-12 ENCOUNTER — Other Ambulatory Visit (HOSPITAL_COMMUNITY): Payer: Self-pay

## 2022-04-16 ENCOUNTER — Other Ambulatory Visit (HOSPITAL_COMMUNITY): Payer: Self-pay

## 2022-04-18 ENCOUNTER — Other Ambulatory Visit (HOSPITAL_COMMUNITY): Payer: Self-pay

## 2022-05-04 ENCOUNTER — Other Ambulatory Visit (HOSPITAL_COMMUNITY): Payer: Self-pay

## 2022-05-10 ENCOUNTER — Other Ambulatory Visit (HOSPITAL_COMMUNITY): Payer: Self-pay

## 2022-05-11 ENCOUNTER — Other Ambulatory Visit (HOSPITAL_COMMUNITY): Payer: Self-pay

## 2022-05-24 ENCOUNTER — Other Ambulatory Visit (HOSPITAL_COMMUNITY): Payer: Self-pay

## 2022-06-15 ENCOUNTER — Other Ambulatory Visit (HOSPITAL_COMMUNITY): Payer: Self-pay

## 2022-06-18 ENCOUNTER — Other Ambulatory Visit (HOSPITAL_COMMUNITY): Payer: Self-pay

## 2022-06-18 ENCOUNTER — Other Ambulatory Visit: Payer: Self-pay

## 2022-06-18 DIAGNOSIS — B20 Human immunodeficiency virus [HIV] disease: Secondary | ICD-10-CM

## 2022-06-18 MED ORDER — DOVATO 50-300 MG PO TABS
1.0000 | ORAL_TABLET | Freq: Every day | ORAL | 2 refills | Status: DC
  Filled 2022-06-18: qty 90, 90d supply, fill #0

## 2022-06-18 MED ORDER — DOVATO 50-300 MG PO TABS: 1.0000 | ORAL_TABLET | Freq: Every day | ORAL | 2 refills | Status: DC

## 2022-06-20 ENCOUNTER — Other Ambulatory Visit: Payer: Self-pay

## 2022-06-20 DIAGNOSIS — B20 Human immunodeficiency virus [HIV] disease: Secondary | ICD-10-CM

## 2022-06-20 MED ORDER — DOVATO 50-300 MG PO TABS: 1.0000 | ORAL_TABLET | Freq: Every day | ORAL | 0 refills | Status: DC

## 2022-06-28 ENCOUNTER — Other Ambulatory Visit (HOSPITAL_COMMUNITY): Payer: Self-pay

## 2022-09-26 ENCOUNTER — Other Ambulatory Visit (HOSPITAL_COMMUNITY): Payer: Self-pay

## 2022-09-27 ENCOUNTER — Other Ambulatory Visit (HOSPITAL_COMMUNITY): Payer: Self-pay

## 2022-10-31 ENCOUNTER — Other Ambulatory Visit: Payer: Self-pay | Admitting: Infectious Diseases

## 2022-10-31 DIAGNOSIS — B20 Human immunodeficiency virus [HIV] disease: Secondary | ICD-10-CM

## 2022-11-17 IMAGING — DX DG CHEST 2V
2 series · 2 of 2 positions shown · non-contrast
Comparison: None.

CLINICAL DATA: History of positive TB test.  No chest complaints.

EXAM:
CHEST - 2 VIEW

[dg chest 2 view (1 of 2)]
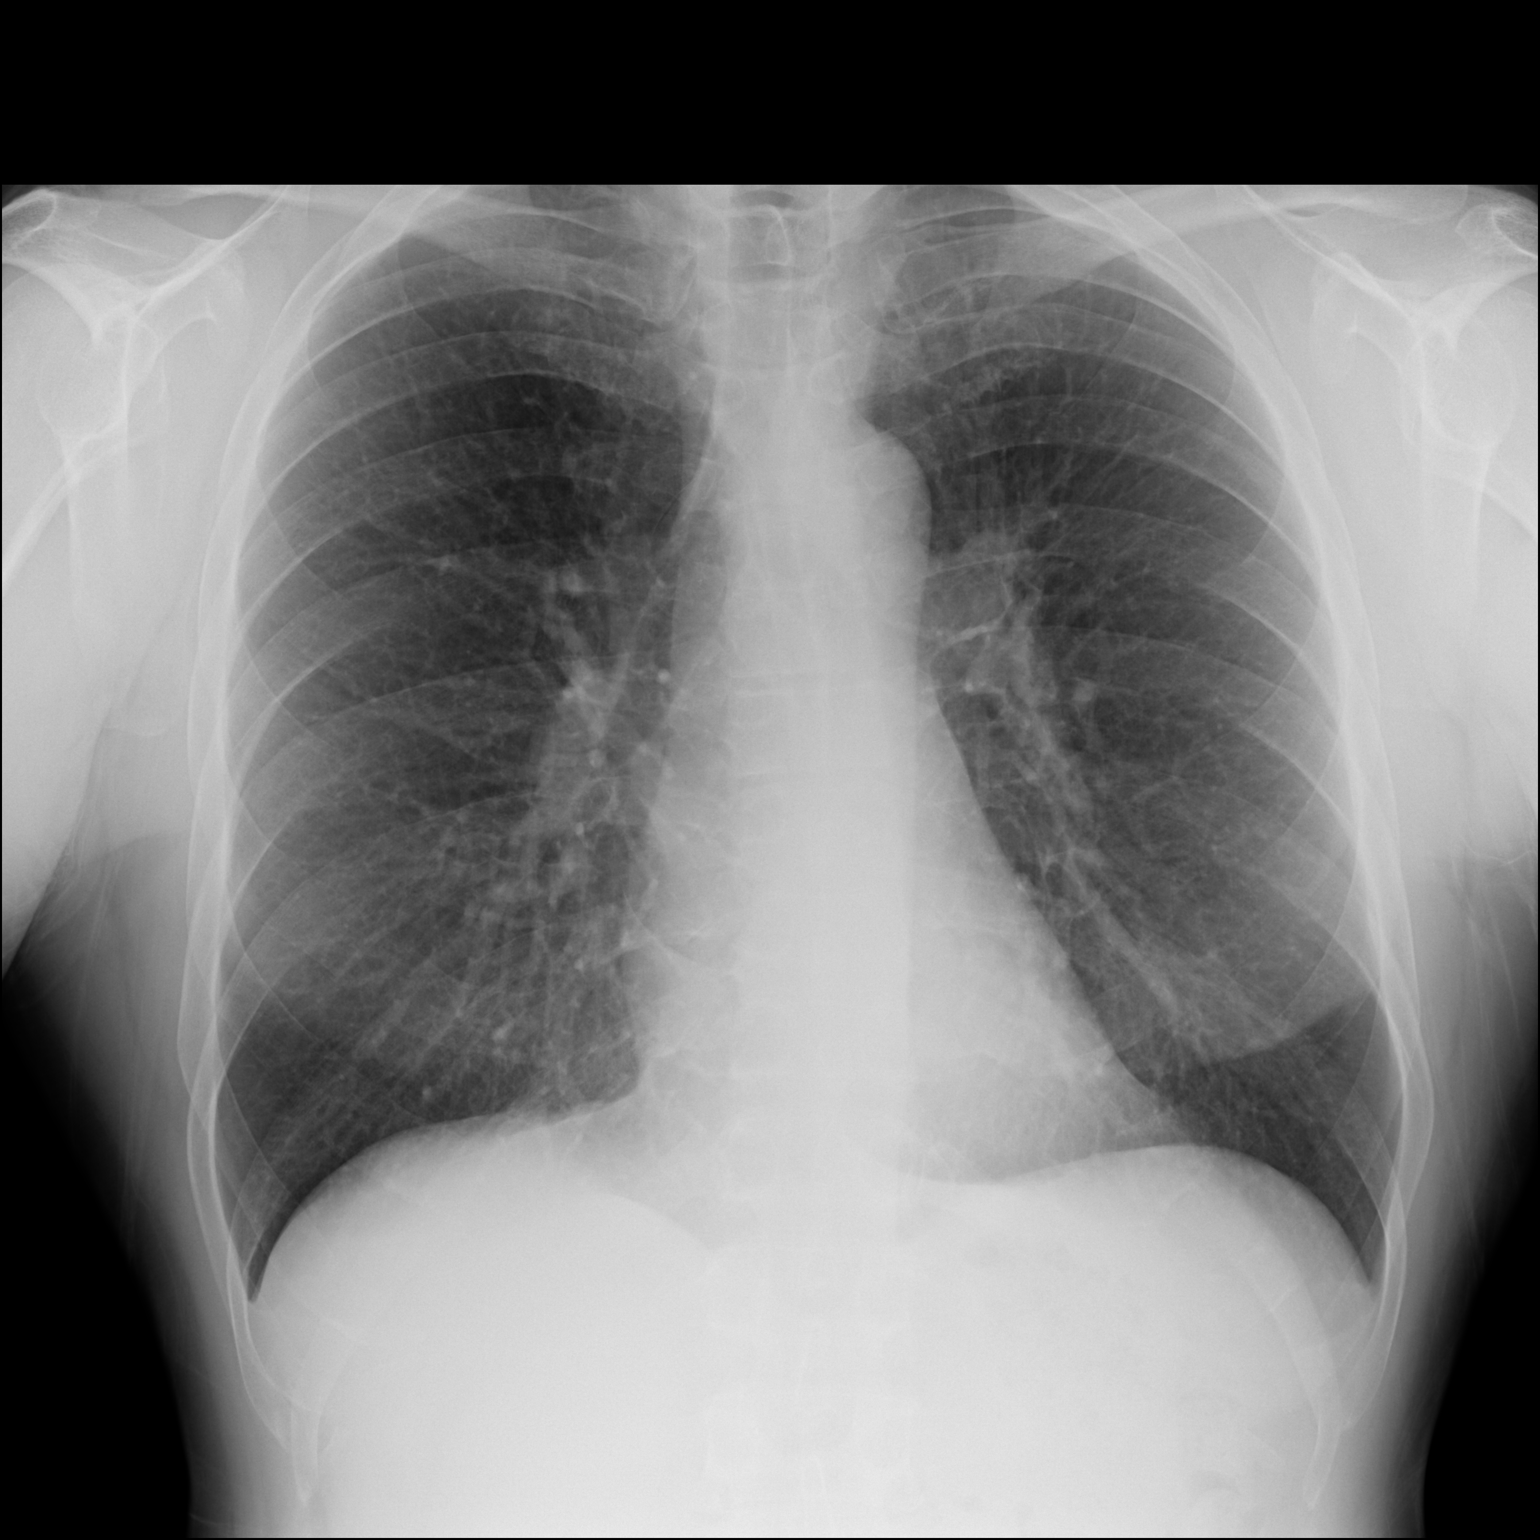

[dg chest 2 view (2 of 2)]
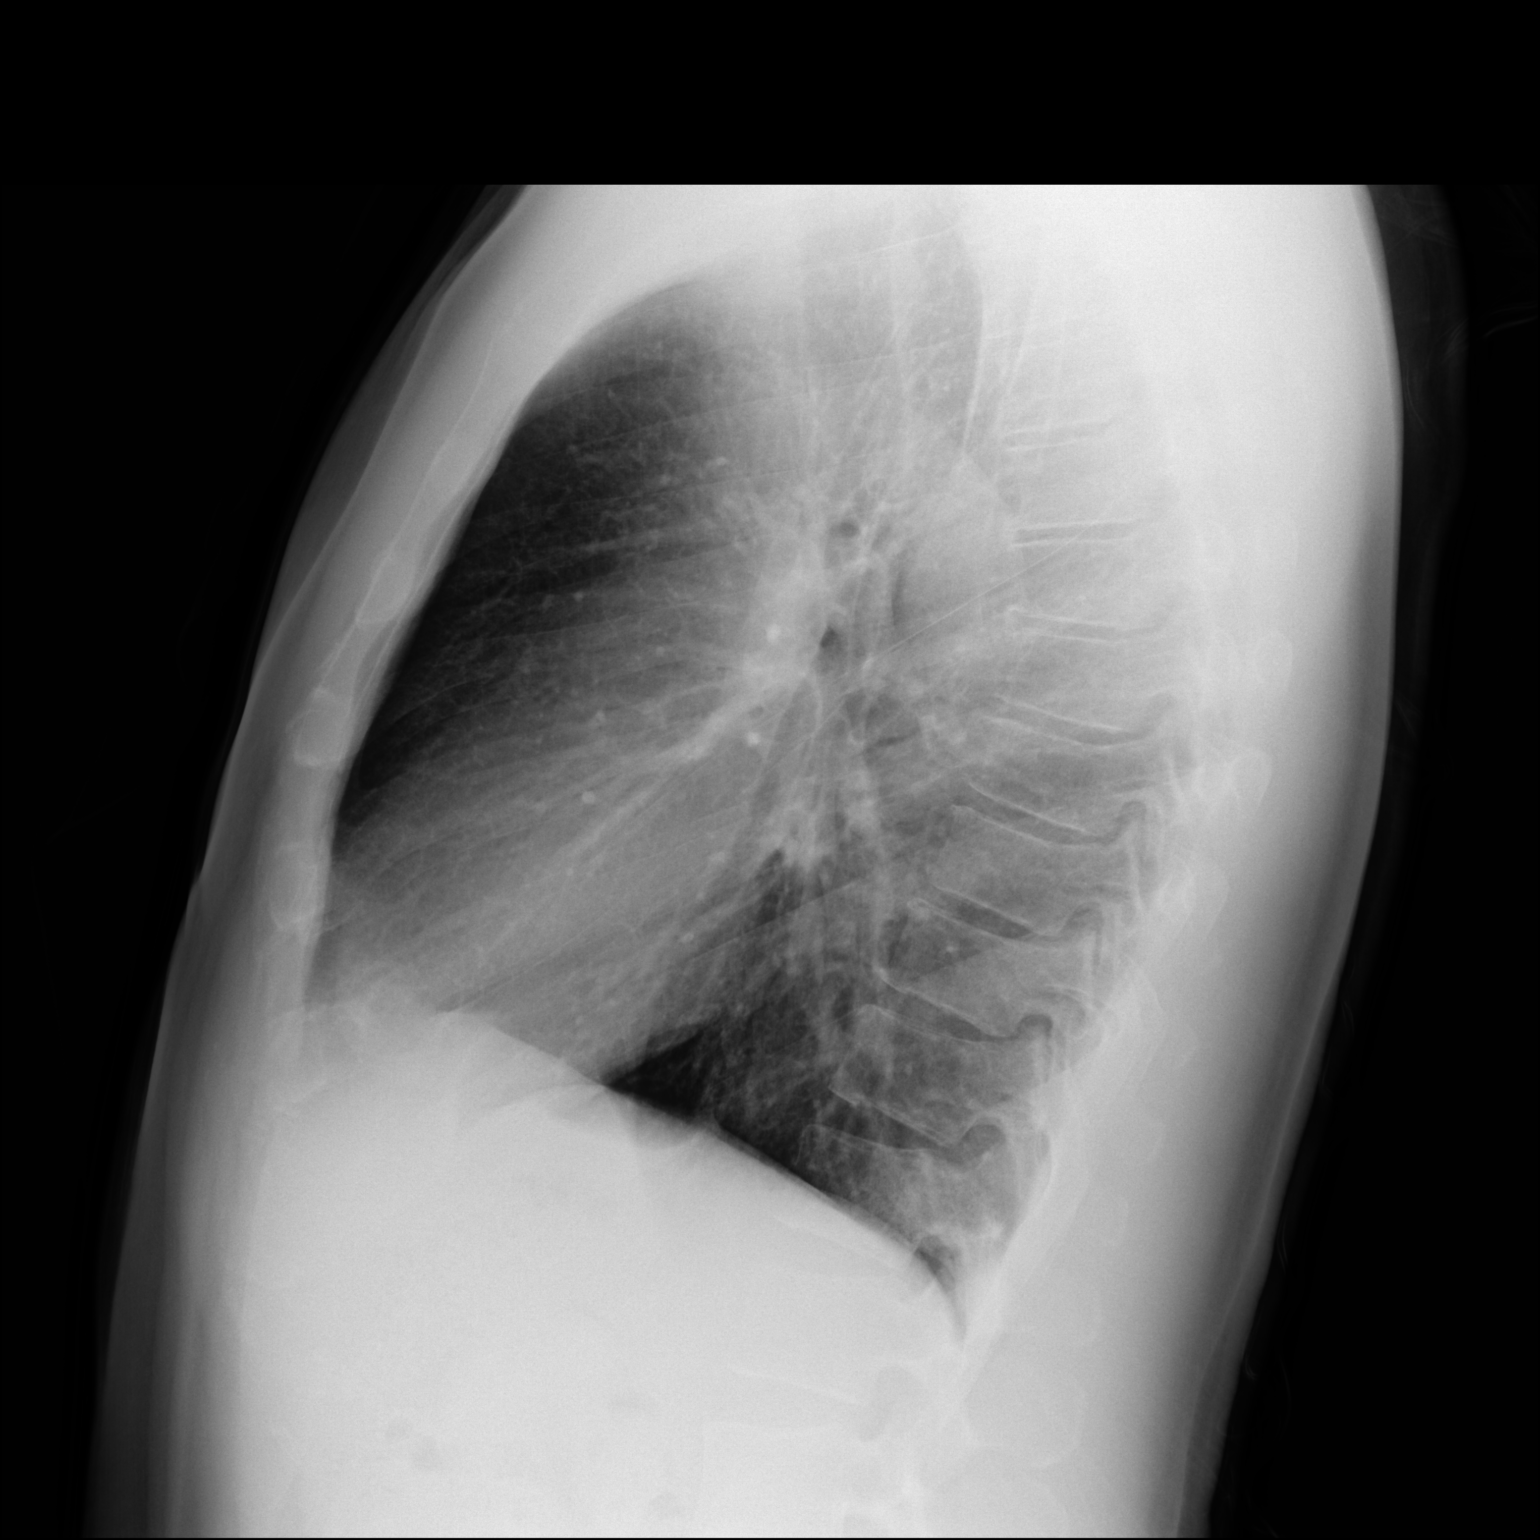

[2 of 2 positions shown; findings below may reference images not displayed]

FINDINGS: The heart size and mediastinal contours are within normal limits.
Both lungs are clear. The visualized skeletal structures are
unremarkable.
IMPRESSION: No active cardiopulmonary disease.

## 2023-01-11 ENCOUNTER — Telehealth: Payer: Self-pay

## 2023-01-11 DIAGNOSIS — B20 Human immunodeficiency virus [HIV] disease: Secondary | ICD-10-CM

## 2023-01-11 MED ORDER — DOVATO 50-300 MG PO TABS: ORAL_TABLET | ORAL | 0 refills | Status: DC

## 2023-01-11 NOTE — Telephone Encounter (Signed)
Spoke with Jared [name] and scheduled him for an appointment with Judeth Cornfield. He has had some problems with his Dovato copay due to his high deductible plan. He is about to enroll in a new plan with his employer. Will send in 30 day supply to get him to his next appointment.   Sandie Ano, RN

## 2023-01-31 ENCOUNTER — Encounter: Payer: Self-pay | Admitting: Infectious Diseases

## 2023-01-31 ENCOUNTER — Other Ambulatory Visit: Payer: Self-pay

## 2023-01-31 ENCOUNTER — Other Ambulatory Visit (HOSPITAL_COMMUNITY): Payer: Self-pay

## 2023-01-31 ENCOUNTER — Ambulatory Visit: Payer: No Typology Code available for payment source | Admitting: Infectious Diseases

## 2023-01-31 VITALS — BP 134/86 | HR 100 | Temp 98.7°F | Wt 219.0 lb

## 2023-01-31 DIAGNOSIS — Z23 Encounter for immunization: Secondary | ICD-10-CM

## 2023-01-31 DIAGNOSIS — B20 Human immunodeficiency virus [HIV] disease: Secondary | ICD-10-CM

## 2023-01-31 MED ORDER — DOVATO 50-300 MG PO TABS: 1.0000 | ORAL_TABLET | Freq: Every day | ORAL | 0 refills | Status: DC

## 2023-01-31 NOTE — Patient Instructions (Addendum)
Nice to see you today!  Will get you a month of samples of your dovato through the end of the year until your new insurance kicks in.   Please come back to see me again in 6 months so we can check your cancer screen and make sure all is OK with your medication.

## 2023-01-31 NOTE — Progress Notes (Signed)
Name: Jared [name]  DOB: 06-13-1970 MRN: [id number] PCP: Lenox Ponds, MD      Brief Narrative:  Jared [name] is a 52 y.o. male with well controlled HIV disease, Dx in [place] in 2007.  CD4 nadir > 200.  HIV Risk: sexual (MSM/heterosexual) History of OIs: none known Intake Labs 06/2020: Hep B sAg (-), sAb (+), cAb (-); Hep A (+), Hep C (-) Quantiferon (+ --> s/p LTBI tx 2016 67m isoniazid) HLA B*5701 (-) G6PD: () Toxo IgG 2016: (-)   Previous Regimens: 2007 - Norvir, Epzicom (two other medication with this but cannot recall) Stribild  genvoya  Dovato    Genotypes: 2016: "on file with [facility] but not available to see"  Subjective   Chief Complaint  Patient presents with   Follow-up     Discussed the use of AI scribe software for clinical note transcription with the patient, who gave verbal consent to proceed.  History of Present Illness   The patient, with a history of HIV, presents with concerns about the affordability of their antiretroviral medication, Dovato. They report having been on a high deductible insurance plan which has led to difficulties in affording their medication. They have had periods of waiting almost a month to get their medication due to the high deductible. They took their last pill recently and are now out of medication. They are facing a $400 cost for a refill. They have received some copay assistance in the past, but believe it has maxed out. They are due to switch to a lower copay plan with [employer] next year, which they hope will alleviate some of the financial stress.  In addition to their HIV, the patient also reports issues with gastroesophageal reflux disease (GERD). They have been experiencing persistent burping and a sensation of something being stuck in their esophagus after eating or drinking. They have not noticed any improvement despite attempts to eat better. They have not had an endoscopy before but are considering it  due to the persistent symptoms. They have been taking medication for blood pressure and reflux.  The patient has a history of both male and male partners. They have not had any recent sexual activity. They have not had any symptoms of HPV or genital warts. They have been offered HPV-related cancer screenings but have not yet decided to proceed.  The patient also reports occasional issues with internal hemorrhoids, but these have not been severe enough to require medical intervention. They have been managing the condition with good fiber intake, hydration, and physical activity. They have not had any rectal bleeding or changes in bowel movements.      Review of Systems  Constitutional:  Negative for chills, fever, malaise/fatigue and weight loss.  HENT:  Negative for sore throat.        No dental problems  Respiratory:  Negative for cough and sputum production.   Cardiovascular:  Negative for chest pain and leg swelling.  Gastrointestinal:  Negative for abdominal pain, diarrhea and vomiting.  Genitourinary:  Negative for dysuria and flank pain.  Musculoskeletal:  Negative for joint pain, myalgias and neck pain.  Skin:  Negative for rash.  Neurological:  Negative for dizziness, tingling and headaches.  Psychiatric/Behavioral:  Negative for depression and substance abuse. The patient is not nervous/anxious and does not have insomnia.     Past Medical History:  Diagnosis Date   HIV infection (HCC)    Hyperlipidemia    Hypertension    Latent tuberculosis infection  06/17/2014   Formatting of this note might be different from the original. Received daily INH for 8.5 months Formatting of this note might be different from the original. Received daily INH for 8.5 months Received daily INH for 8.5 months Formatting of this note might be different from the original. Received daily INH for 8.5 months   Prostate cancer [facility])     Outpatient Medications Prior to Visit  Medication Sig Dispense Refill    amLODipine (NORVASC) 5 MG tablet Take 1 tablet by mouth daily.     ascorbic acid (VITAMIN C) 1000 MG tablet Vitamin C     dolutegravir-lamiVUDine (DOVATO) 50-300 MG tablet TAKE 1 TABLET BY MOUTH 1 TIME A DAY 30 tablet 0   Multiple Vitamin (MULTI-VITAMIN) tablet Take by mouth.     valACYclovir (VALTREX) 500 MG tablet Take 500 mg by mouth 2 (two) times daily.     ipratropium (ATROVENT) 0.06 % nasal spray 2 sprays 3 (three) times a day for 21 days.     omeprazole (PRILOSEC) 20 MG capsule omeprazole 20 mg capsule,delayed release  TAKE 1 CAPSULE BY MOUTH EVERY DAY     No facility-administered medications prior to visit.     Allergies  Allergen Reactions   No Known Allergies     Social History   Tobacco Use   Smoking status: Former   Smokeless tobacco: Never   Tobacco comments:    passive, when a kid, not habitual  Substance Use Topics   Alcohol use: Not Currently   Drug use: Not Currently    Social History   Substance and Sexual Activity  Sexual Activity Not Currently   Partners: Male, Male       Objective   Vitals:   01/31/23 1556  BP: 134/86  Pulse: 100  Temp: 98.7 F (37.1 C)  TempSrc: Oral  SpO2: 100%  Weight: 219 lb (99.3 kg)   Body mass index is 28.12 kg/m.   Physical Exam Vitals reviewed.  Constitutional:      Appearance: Normal appearance. He is not ill-appearing.  HENT:     Nose: No congestion.  Eyes:     General: No scleral icterus.    Pupils: Pupils are equal, round, and reactive to light.  Cardiovascular:     Rate and Rhythm: Normal rate and regular rhythm.     Heart sounds: No murmur heard. Pulmonary:     Effort: Pulmonary effort is normal.     Breath sounds: Normal breath sounds.  Abdominal:     General: There is no distension.     Palpations: Abdomen is soft.  Musculoskeletal:        General: Normal range of motion.     Cervical back: Normal range of motion.  Skin:    General: Skin is warm and dry.     Capillary Refill:  Capillary refill takes less than 2 seconds.  Neurological:     Mental Status: He is alert and oriented to person, place, and time.     Lab Results Lab Results  Component Value Date   WBC 4.4 07/11/2020   HGB 14.9 07/11/2020   HCT 44.7 07/11/2020   MCV 88.2 07/11/2020   PLT 249 07/11/2020    Lab Results  Component Value Date   CREATININE 1.30 07/11/2020   BUN 11 07/11/2020   NA 138 07/11/2020   K 4.2 07/11/2020   CL 103 07/11/2020   CO2 30 07/11/2020    Lab Results  Component Value Date   ALT 11  07/11/2020   AST 14 07/11/2020   BILITOT 0.5 07/11/2020    Lab Results  Component Value Date   CHOL 153 07/11/2020   HDL 58 07/11/2020   LDLCALC 76 07/11/2020   TRIG 105 07/11/2020   CHOLHDL 2.6 07/11/2020   HIV 1 RNA Quant  Date Value  02/13/2021 Not Detected Copies/mL  07/11/2020 NOT DETECTED copies/mL   CD4 T Cell Abs (/uL)  Date Value  02/26/2022 348 (L)  02/13/2021 547  07/11/2020 702       Assessment & Plan:     HIV Difficulty affording Dovato due to high insurance deductible. Exhausted ViiV copay assistance. Otherwise very well controlled virologically with good immunologic recovery. New insurance policy January 1 which he thinks will afford a cheaper copay. No dental needs today. No concern over anxious/depressed mood. Sexual health and family planning discussed - discussed anal cancer screening.  Vaccines updated today - see health maintenance section.  -Provide 2 months of Dovato samples today. -VL, CD4, RPR today for routine care.  -reviewed CMP, CBC and lipid screening collected by PCP  -Statin primary prevention to be discussed - 10 yr risk calculated 10.6%  Gastroesophageal Reflux Disease (GERD) Persistent symptoms despite lifestyle modifications. Patient expressed interest in endoscopy. -Recommend discussing with primary care provider, Dr. Edward Jolly, about referral for GI consultation and potential endoscopy.  Hemorrhoids, Internal Patient reports  internal hemorrhoids. Not currently problematic  -Advise on maintaining good bowel habits, including adequate hydration and fiber intake.  HPV-related cancer screening Patient has history of both male and male partners. Discussed new guidelines for HPV-related rectal cancer screening. -Offered to perform screening today, but patient prefers to defer. Plan to revisit at next visit.  Immunizations Patient has not received flu shot or COVID-19 booster this year. -Administer flu shot and COVID-19 booster today -Prevnar 20 next OV to complete pneumococcal series  -aged out HPV and Menveo   Meds ordered this encounter  Medications   dolutegravir-lamiVUDine (DOVATO) 50-300 MG tablet    Sig: Take 1 tablet by mouth daily.    Dispense:  60 tablet    Refill:  0   Orders Placed This Encounter  Procedures   Flu vaccine trivalent PF, 6mos and older(Flulaval,Afluria,Fluarix,Fluzone)   Pfizer Comirnaty Covid-19 Vaccine 22yrs & older   HIV 1 RNA quant-no reflex-bld   T-helper cells (CD4) count   RPR        Rexene Alberts, MSN, NP-C [facility] for Infectious Disease [facility] Pager: [phone] Office: [phone]  01/31/23  4:55 PM

## 2023-02-01 LAB — T-HELPER CELLS (CD4) COUNT (NOT AT ARMC)
CD4 % Helper T Cell: 45 % (ref 33–65)
CD4 T Cell Abs: 453 /uL (ref 400–1790)

## 2023-02-04 LAB — RPR: RPR Ser Ql: NONREACTIVE

## 2023-02-04 LAB — HIV-1 RNA QUANT-NO REFLEX-BLD
HIV 1 RNA Quant: 102 {copies}/mL — ABNORMAL HIGH
HIV-1 RNA Quant, Log: 2.01 {Log_copies}/mL — ABNORMAL HIGH

## 2023-02-07 ENCOUNTER — Other Ambulatory Visit: Payer: Self-pay | Admitting: Pharmacist

## 2023-02-07 DIAGNOSIS — B20 Human immunodeficiency virus [HIV] disease: Secondary | ICD-10-CM

## 2023-02-07 MED ORDER — BICTEGRAVIR-EMTRICITAB-TENOFOV 50-200-25 MG PO TABS: 1.0000 | ORAL_TABLET | Freq: Every day | ORAL | Status: AC

## 2023-02-07 NOTE — Progress Notes (Signed)
Medication Samples have been provided to the patient.  Drug name: Dovato        Strength: 50/300 mg         Qty: 56  Tablets (4 bottles) LOT: NWGY   Exp.Date: 2/26  Dosing instructions: Take one tablet by mouth once daily  The patient has been instructed regarding the correct time, dose, and frequency of taking this medication, including desired effects and most common side effects.   Margarite Gouge, PharmD, CPP, BCIDP, AAHIVP Clinical Pharmacist Practitioner Infectious Diseases Clinical Pharmacist [facility] for Infectious Disease

## 2023-06-17 ENCOUNTER — Other Ambulatory Visit: Payer: Self-pay | Admitting: Infectious Diseases

## 2023-06-17 DIAGNOSIS — B20 Human immunodeficiency virus [HIV] disease: Secondary | ICD-10-CM

## 2023-06-18 ENCOUNTER — Other Ambulatory Visit (HOSPITAL_COMMUNITY): Payer: Self-pay

## 2023-06-18 ENCOUNTER — Telehealth: Payer: Self-pay

## 2023-06-18 NOTE — Telephone Encounter (Signed)
 Called patient regarding note from [facility] where patient states he is not able to fill medication. "not been able to afford to pick it up secondary to a $1400 balance." Patient states that he had high deductible with previous plan and was not aware of remanding balance. Had to pay $400 for recent refill.  Spoke with Lupita Leash, Pharmacy tech who was able to provide pt with [employer] program contact info. Can offer samples if needed.  Juanita Laster, RMA

## 2023-07-25 ENCOUNTER — Encounter: Payer: Self-pay | Admitting: Infectious Diseases

## 2023-07-25 ENCOUNTER — Other Ambulatory Visit (HOSPITAL_COMMUNITY): Payer: Self-pay

## 2023-07-25 ENCOUNTER — Ambulatory Visit (INDEPENDENT_AMBULATORY_CARE_PROVIDER_SITE_OTHER): Payer: Self-pay | Admitting: Infectious Diseases

## 2023-07-25 ENCOUNTER — Other Ambulatory Visit: Payer: Self-pay

## 2023-07-25 VITALS — BP 102/72 | HR 71 | Temp 98.6°F | Ht 74.0 in | Wt 210.0 lb

## 2023-07-25 DIAGNOSIS — B20 Human immunodeficiency virus [HIV] disease: Secondary | ICD-10-CM

## 2023-07-25 DIAGNOSIS — B009 Herpesviral infection, unspecified: Secondary | ICD-10-CM

## 2023-07-25 DIAGNOSIS — J309 Allergic rhinitis, unspecified: Secondary | ICD-10-CM

## 2023-07-25 DIAGNOSIS — I10 Essential (primary) hypertension: Secondary | ICD-10-CM

## 2023-07-25 MED ORDER — VALACYCLOVIR HCL 500 MG PO TABS
500.0000 mg | ORAL_TABLET | Freq: Two times a day (BID) | ORAL | 0 refills | Status: AC
  Filled 2023-07-25 – 2023-12-16 (×3): qty 60, 30d supply, fill #0

## 2023-07-25 NOTE — Progress Notes (Signed)
 Name: Jared [name]  DOB: Oct 11, 1970 MRN: [id number] PCP: Michele Ahle, MD      Brief Narrative:  Jared [name] is a 53 y.o. male with well controlled HIV disease, Dx in [place]  in 2007.  CD4 nadir > 200.  HIV Risk: sexual (MSM/heterosexual) History of OIs: none known Intake Labs 06/2020: Hep B sAg (-), sAb (+), cAb (-); Hep A (+), Hep C (-) Quantiferon (+ --> s/p LTBI tx 2016 34m isoniazid) HLA B*5701 (-) G6PD: () Toxo IgG 2016: (-)   Previous Regimens: 2007 - Norvir, Epzicom (two other medication with this but cannot recall) Stribild  genvoya   Dovato     Genotypes: 2016: "on file with [facility] but not available to see"  Subjective   Chief Complaint  Patient presents with   Follow-up     Discussed the use of AI scribe software for clinical note transcription with the patient, who gave verbal consent to proceed.  History of Present Illness   Jared [name] is a 53 year old male with HIV who presents for a routine follow-up visit.  He has been managing his HIV with Dovato , adhering to his medication regimen for the past two months. His last lab work in November showed a viral load of 102, which is within the undetectable range. He continues to take his medications consistently.  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 Due to a recent job loss, he is currently paying out of pocket for Valtrex .  He has severe pollen allergies and has been using allergy drops, which have improved his symptoms. He wears a mask outdoors to help filter pollen.  He recently lost his job and is now without insurance, planning to use COBRA for the next three months. He is currently paying out of pocket for some medications and is considering financial assistance options.      Review of Systems  Constitutional:  Negative for chills, fever, malaise/fatigue and weight loss.  HENT:  Negative for sore throat.        No dental  problems  Respiratory:  Negative for cough and sputum production.   Cardiovascular:  Negative for chest pain and leg swelling.  Gastrointestinal:  Negative for abdominal pain, diarrhea and vomiting.  Genitourinary:  Negative for dysuria and flank pain.  Musculoskeletal:  Negative for joint pain, myalgias and neck pain.  Skin:  Negative for rash.  Neurological:  Negative for dizziness, tingling and headaches.  Psychiatric/Behavioral:  Negative for depression and substance abuse. The patient is not nervous/anxious and does not have insomnia.     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 Received daily INH for 8.5 months Formatting of this note might be different from the original. Received daily INH for 8.5 months Received daily INH for 8.5 months Formatting of this note might be different from the original. Received daily INH for 8.5 months   Prostate cancer [facility])     Outpatient Medications Prior to Visit  Medication Sig Dispense Refill   amLODipine (NORVASC) 5 MG tablet Take 1 tablet by mouth daily.     ascorbic acid (VITAMIN C) 1000 MG tablet Vitamin C     DOVATO  50-300 MG tablet TAKE 1 TABLET BY MOUTH 1 TIME A DAY 30 tablet 1   Multiple  Vitamin (MULTI-VITAMIN) tablet Take by mouth.     valACYclovir  (VALTREX ) 500 MG tablet Take 500 mg by mouth 2 (two) times daily.     omeprazole (PRILOSEC) 20 MG capsule omeprazole 20 mg capsule,delayed release  TAKE 1 CAPSULE BY MOUTH EVERY DAY     No facility-administered medications prior to visit.     Allergies  Allergen Reactions   No Known Allergies     Social History   Tobacco Use   Smoking status: Former   Smokeless tobacco: Never   Tobacco comments:    passive, when a kid, not habitual  Substance Use Topics   Alcohol use: Not Currently   Drug use: Not Currently     Social History   Substance and Sexual Activity  Sexual Activity Not Currently   Partners: Male, Male       Objective   Vitals:   07/25/23 1555  BP: 102/72  Pulse: 71  Temp: 98.6 F (37 C)  TempSrc: Temporal  SpO2: 96%  Weight: 210 lb (95.3 kg)  Height: 6\' 2"  (1.88 m)   Body mass index is 26.96 kg/m.   Physical Exam Vitals reviewed.  Constitutional:      Appearance: Normal appearance. He is not ill-appearing.  HENT:     Nose: No congestion.  Eyes:     General: No scleral icterus.    Pupils: Pupils are equal, round, and reactive to light.  Cardiovascular:     Rate and Rhythm: Normal rate and regular rhythm.     Heart sounds: No murmur heard. Pulmonary:     Effort: Pulmonary effort is normal.     Breath sounds: Normal breath sounds.  Abdominal:     General: There is no distension.     Palpations: Abdomen is soft.  Musculoskeletal:        General: Normal range of motion.     Cervical back: Normal range of motion.  Skin:    General: Skin is warm and dry.     Capillary Refill: Capillary refill takes less than 2 seconds.  Neurological:     Mental Status: He is alert and oriented to person, place, and time.     Lab Results Lab Results  Component Value Date   WBC 4.4 07/11/2020   HGB 14.9 07/11/2020   HCT 44.7 07/11/2020   MCV 88.2 07/11/2020   PLT 249 07/11/2020    Lab Results  Component Value Date   CREATININE 1.30 07/11/2020   BUN 11 07/11/2020   NA 138 07/11/2020   K 4.2 07/11/2020   CL 103 07/11/2020   CO2 30 07/11/2020    Lab Results  Component Value Date   ALT 11 07/11/2020   AST 14 07/11/2020   BILITOT 0.5 07/11/2020    Lab Results  Component Value Date   CHOL 153 07/11/2020   HDL 58 07/11/2020   LDLCALC 76 07/11/2020   TRIG 105 07/11/2020   CHOLHDL 2.6 07/11/2020   HIV 1 RNA Quant  Date Value  01/31/2023 102 Copies/mL (H)  02/13/2021 Not Detected Copies/mL  07/11/2020 NOT DETECTED copies/mL   CD4 T Cell Abs (/uL)   Date Value  01/31/2023 453  02/26/2022 348 (L)  02/13/2021 547       Assessment & Plan:     HIV infection - HIV infection is well-managed with a viral load of 102, within the undetectable range. He has been adherent to his medication regimen for the past two months. Due to recent loss of insurance, there is  a need to explore patient assistance programs to continue affording Dovato . - Arrange for patient assistance to help afford Dovato . - Defer lab work until a more secure payment situation is established. - Repeat viral load test when feasible to ensure it remains in the double-digit range. - Needs anal cancer screening and Prevnar 20  Herpes Simplex Virus (HSV) infection - He is currently paying out of pocket for Valtrex  due to insurance issues. A GoodRx coupon is available to reduce costs. A refill for Valtrex  has been sent to [facility], which offers delivery services. - Utilize GoodRx coupon for Valtrex  to reduce costs. - Send Valtrex  refill to [facility] for delivery.  Hypertension - Hypertension is well-controlled with current medication regimen. Blood pressure readings are within normal limits. He recently refilled his amlodipine prescription and has an adequate supply.  Allergic rhinitis Allergic rhinitis has been exacerbated by pollen exposure, but symptoms are managed with allergy drops and mask use outdoors.      Meds ordered this encounter  Medications   valACYclovir  (VALTREX ) 500 MG tablet    Sig: Take 1 tablet (500 mg total) by mouth 2 (two) times daily.    Dispense:  60 tablet    Refill:  0    Patient would like this delivered to home address.    Supervising Provider:   Liane Redman [phone]   Orders Placed This Encounter  Procedures   HIV 1 RNA quant-no reflex-bld    Standing Status:   Future    Expected Date:   08/19/2023    Expiration Date:   07/24/2024   T-helper cells (CD4) count    Standing Status:   Future    Expected Date:    08/19/2023    Expiration Date:   07/24/2024   COMPLETE METABOLIC PANEL WITHOUT GFR    Standing Status:   Future    Expected Date:   08/19/2023    Expiration Date:   07/24/2024        Gibson Kurtz, MSN, NP-C [facility] Pager: [phone] Office: [phone]  08/15/23  12:26 PM

## 2023-07-25 NOTE — Patient Instructions (Addendum)
 Will get you set up to meet with financial advisor here to see if we can get you eligible for temporary patient assistance for your medications and labs / care while you get re-situated.   Please conitnue your Dovato   I sent in refills for Valtrex to [facility] for mail order pharmacy - they should call you to set up delivery   If needed Good Rx Coupon seemed to have it for $30   We can hold off on labs for the moment until you get insurance or patient assistance worked out - we can schedule a separate lab visit in the future inJune when we need to update your coverage   Bring the COBRA information with you to the appointment with Deanna to go over all your options

## 2023-07-26 ENCOUNTER — Other Ambulatory Visit: Payer: Self-pay

## 2023-07-26 ENCOUNTER — Encounter: Payer: Self-pay | Admitting: Pharmacist

## 2023-07-30 ENCOUNTER — Other Ambulatory Visit: Payer: Self-pay

## 2023-07-31 ENCOUNTER — Other Ambulatory Visit: Payer: Self-pay

## 2023-07-31 ENCOUNTER — Other Ambulatory Visit: Payer: Self-pay | Admitting: Pharmacist

## 2023-07-31 DIAGNOSIS — B20 Human immunodeficiency virus [HIV] disease: Secondary | ICD-10-CM

## 2023-07-31 MED ORDER — DOVATO 50-300 MG PO TABS: 1.0000 | ORAL_TABLET | Freq: Every day | ORAL | Status: AC

## 2023-07-31 NOTE — Progress Notes (Signed)
 Medication Samples have been provided to the patient.  Drug name: Dovato        Strength: 50/300 mg         Qty: 14  Tablets (1 bottles) LOT: [id number]   Exp.Date: 7/26  Samples requested by Gibson Kurtz, NP.  Dosing instructions: Take one tablet by mouth once daily  The patient has been instructed regarding the correct time, dose, and frequency of taking this medication, including desired effects and most common side effects.   Nicklas Barns, PharmD, CPP, BCIDP, AAHIVP Clinical Pharmacist Practitioner Infectious Diseases Clinical Pharmacist [facility]

## 2023-08-19 ENCOUNTER — Ambulatory Visit: Payer: Self-pay

## 2023-08-19 ENCOUNTER — Other Ambulatory Visit: Payer: Self-pay

## 2023-08-19 DIAGNOSIS — B20 Human immunodeficiency virus [HIV] disease: Secondary | ICD-10-CM

## 2023-08-20 LAB — T-HELPER CELLS (CD4) COUNT (NOT AT ARMC)
CD4 % Helper T Cell: 38 % (ref 33–65)
CD4 T Cell Abs: 577 /uL (ref 400–1790)

## 2023-08-21 ENCOUNTER — Ambulatory Visit: Payer: Self-pay | Admitting: Infectious Diseases

## 2023-08-21 LAB — COMPLETE METABOLIC PANEL WITHOUT GFR
AG Ratio: 1.5 (calc) (ref 1.0–2.5)
ALT: 14 U/L (ref 9–46)
AST: 14 U/L (ref 10–35)
Albumin: 4.3 g/dL (ref 3.6–5.1)
Alkaline phosphatase (APISO): 60 U/L (ref 35–144)
BUN/Creatinine Ratio: 7 (calc) (ref 6–22)
BUN: 9 mg/dL (ref 7–25)
CO2: 28 mmol/L (ref 20–32)
Calcium: 9.1 mg/dL (ref 8.6–10.3)
Chloride: 104 mmol/L (ref 98–110)
Creat: 1.32 mg/dL — ABNORMAL HIGH (ref 0.70–1.30)
Globulin: 2.9 g/dL (ref 1.9–3.7)
Glucose, Bld: 81 mg/dL (ref 65–99)
Potassium: 4.1 mmol/L (ref 3.5–5.3)
Sodium: 140 mmol/L (ref 135–146)
Total Bilirubin: 0.5 mg/dL (ref 0.2–1.2)
Total Protein: 7.2 g/dL (ref 6.1–8.1)

## 2023-08-21 LAB — HIV-1 RNA QUANT-NO REFLEX-BLD
HIV 1 RNA Quant: <20 {copies}/mL — AB
HIV-1 RNA Quant, Log: <1.3 {Log_copies}/mL — AB

## 2023-10-16 NOTE — Progress Notes (Signed)
 Subjective Patient ID: Jared [name] is a 53 y.o. male.  Chief Complaint  Patient presents with  . Neck Pain    Right side behind ear down to base of neck. X1 mo. Can be dulll, can be sharp    The following information was reviewed by members of the visit team:  Tobacco  Allergies  Meds  Problems  Med Hx  Surg Hx  Fam Hx  Soc  Hx     HPI Jared [name] is a 53 y.o. male here today to for right sided neck pain and chronic management for:  1. Neck pain on right side   2. HIV infection, unspecified symptom status    (CMD)   3. Essential hypertension   4. Disorder of right mastoid      He complains of right sided neck pain. It starts at the back middle part of his ear and extends to the base of the neck. He said that it has been going on for 1 month. The pain can be described as dull and sharp. The pain comes and goes. He reports taking medications as prescribed.   The following portions of the patient's history were reviewed and updated as appropriate: allergies, current medications, past social history and problem list.   Review of Systems  Constitutional:  Negative for activity change, appetite change, chills, diaphoresis, fatigue, fever and unexpected weight change.  HENT:  Negative for congestion, dental problem, drooling, ear discharge, ear pain, facial swelling, hearing loss, mouth sores, nosebleeds, postnasal drip, rhinorrhea, sinus pressure, sinus pain, sneezing, sore throat, tinnitus, trouble swallowing and voice change.   Eyes:  Negative for photophobia, pain, discharge, redness, itching and visual disturbance.  Respiratory:  Negative for apnea, cough, choking, chest tightness, shortness of breath, wheezing and stridor.   Cardiovascular:  Negative for chest pain, palpitations and leg swelling.  Gastrointestinal:  Negative for abdominal distention, abdominal pain, anal bleeding, blood in stool, constipation, diarrhea, nausea, rectal pain and  vomiting.  Endocrine: Negative for cold intolerance, heat intolerance, polydipsia, polyphagia and polyuria.  Genitourinary:  Negative for decreased urine volume, difficulty urinating, dysuria, enuresis, flank pain, frequency, genital sores, hematuria, penile discharge, penile pain, penile swelling, scrotal swelling, testicular pain and urgency.  Musculoskeletal:  Positive for neck pain. Negative for arthralgias, back pain, gait problem, joint swelling, myalgias and neck stiffness.  Skin:  Negative for color change, pallor, rash and wound.  Allergic/Immunologic: Negative for environmental allergies, food allergies and immunocompromised state.  Neurological:  Negative for dizziness, tremors, seizures, syncope, facial asymmetry, speech difficulty, weakness, light-headedness, numbness and headaches.  Hematological:  Negative for adenopathy. Does not bruise/bleed easily.  Psychiatric/Behavioral:  Negative for agitation, behavioral problems, confusion, decreased concentration, dysphoric mood, hallucinations, self-injury, sleep disturbance and suicidal ideas. The patient is not nervous/anxious and is not hyperactive.     Objective Physical Exam Vitals and nursing note reviewed. Exam conducted with a chaperone present.  Constitutional:      Appearance: Normal appearance.  HENT:     Head: Normocephalic and atraumatic.     Right Ear: External ear normal.     Left Ear: External ear normal.     Nose: Nose normal.   Eyes:     General: No scleral icterus.       Right eye: No discharge.        Left eye: No discharge.     Extraocular Movements: Extraocular movements intact.     Pupils: Pupils are equal, round, and reactive to light.  Neck:     Comments: Tenderness to right lateral neck along sternocleidomastoid extending to mastoid bone. Tenderness to mastoid with palpation. Cardiovascular:     Rate and Rhythm: Normal rate and regular rhythm.     Heart sounds: Normal heart sounds. No murmur  heard.    No friction rub. No gallop.  Pulmonary:     Effort: Pulmonary effort is normal. No respiratory distress.     Breath sounds: Normal breath sounds. No stridor. No wheezing, rhonchi or rales.  Chest:     Chest wall: No tenderness.  Abdominal:     General: Bowel sounds are normal.   Musculoskeletal:     Cervical back: Normal range of motion and neck supple.   Skin:    General: Skin is warm and dry.   Neurological:     General: No focal deficit present.     Mental Status: He is alert and oriented to person, place, and time.   Psychiatric:        Mood and Affect: Mood normal.        Behavior: Behavior normal.        Thought Content: Thought content normal.        Judgment: Judgment normal.     Assessment/Plan Diagnoses and all orders for this visit:  Neck pain on right side Discussed. Plan: check screenings. Will come up with a plan when results come back. -     CT Soft Tissue Neck W Contrast; Future  HIV infection, unspecified symptom status    (CMD) Stable. PLAN: continue current regimen.  Essential hypertension Stable. PLAN: continue current regimen.  Disorder of right mastoid Discussed. Plan: check screenings. Will come up with a plan when results come back. -     CT Soft Tissue Neck W Contrast; Future -     CT Head W Contrast; Future  Plan  See above.  This document serves as a record of services personally performed by Dr. Deane.  It was created on their behalf by Richerd Macario Asa, CMA, a trained medical scribe, and Certified Medical Assistant (CMA). During the course of documenting the history, physical exam and medical decision making, I was functioning as a stage manager. The creation of this record is the provider's dictation and/or activities during the visit.   Electronically signed: Richerd Macario Asa, CMA 10/16/2023  10:55 AM   I agree the documentation is accurate and complete.  Electronically signed by: Camie CHRISTELLA Deane, MD 10/18/2023 12:22  PM

## 2023-10-24 ENCOUNTER — Other Ambulatory Visit: Payer: Self-pay | Admitting: Infectious Diseases

## 2023-10-24 DIAGNOSIS — B20 Human immunodeficiency virus [HIV] disease: Secondary | ICD-10-CM

## 2023-12-16 ENCOUNTER — Other Ambulatory Visit: Payer: Self-pay

## 2023-12-17 ENCOUNTER — Encounter: Payer: Self-pay | Admitting: Pharmacist

## 2023-12-17 ENCOUNTER — Other Ambulatory Visit: Payer: Self-pay

## 2023-12-18 ENCOUNTER — Other Ambulatory Visit (HOSPITAL_COMMUNITY): Payer: Self-pay

## 2023-12-19 NOTE — Telephone Encounter (Signed)
 Copied from CRM #[id number]. Topic: Medication/Rx - Rx Refill >> Dec 19, 2023  3:17 PM [name] wrote: Jared, [name] is calling for medication request (Obtain: Medication Name, Dosage, Quantity, Frequency, Quantity Requested (example: 30-day supply.)   Include all details related to the request(s) below: Called requesting refill of amLODIPine (NORVASC) 5 mg tablet, Take 1 tablet (5 mg total) by mouth daily, he was wondering if he was going to take him off of this medication.  If he does need to come in he is willing too.   Pharmacy [id number] - [place], [place] - [address] - PHONE: [phone] - FAX: [phone]   Confirm and type the Best Contact Number below:  Patient/caller contact number:  [phone]           [] Home  [x] Mobile  [] Work [] Other   [x] Okay to leave a voicemail   Medication List:  Current Outpatient Medications:  .  amLODIPine (NORVASC) 5 mg tablet, Take 1 tablet (5 mg total) by mouth daily. APPOINTMENT REQUIRED FOR FURTHER REFILLS, Disp: 30 tablet, Rfl: 0 .  ascorbic acid (VITAMIN C) 1,000 mg tablet, Vitamin C, Disp: , Rfl:  .  dolutegravir -lamivudine  (Dovato ) 50-300 mg tab per tablet, Take 1 tablet by mouth daily., Disp: 90 tablet, Rfl: 0 .  ipratropium (ATROVENT) 42 mcg (0.06 %) spry nasal spray, 2 sprays 3 (three) times a day for 21 days., Disp: 15 mL, Rfl: 0 .  multivitamin (THERAGRAN) tab tablet, Take  by mouth., Disp: , Rfl:  .  valACYclovir  (VALTREX ) 500 mg tablet, , Disp: , Rfl:      Medication Request/Refills: Pharmacy Information (if applicable)   [] Not Applicable       [x]  Pharmacy listed  Send Medication Request to:                                                 [] Pharmacy not listed (added to pharmacy list in Epic) Send Medication Request to:      Listed Pharmacies: [facility] - [place], [place] - [address] [facility] AT Portal to Registered [facility] - PHONE: [phone] - FAX:  [phone] [id number] - [place], [place] - [address] - PHONE: [phone] - FAX: [phone]

## 2023-12-23 NOTE — Telephone Encounter (Signed)
 Spoke with pt, advised that he does need to be seen for refills. Appt scheduled. He was completely out of medication so 30 day supply sent to pharmacy

## 2024-01-02 NOTE — Progress Notes (Signed)
 [address] - AMBULATORY [facility]  - INTERNAL MEDICINE [facility] [phone] [place] [address]   January 02, 2024  Patient ID: Jared [name] is a 53 y.o. male   Chief Complaint  Patient presents with  . Hypertension  . Hyperlipidemia     HPI: Jared [name] is here for ongoing care of HLD and HTN. Since last visit he has been doing well, he has no concerns today.    Regarding HTN  Patient was last seen 11/08/22 Currently on Amlodipine 5mg  daily  and is taking medications as prescribed  Patient is tolerating medications well.  Patient is not exercising  Patient  does not follow low salt diet.  Patient is not checking blood pressure daily. Patient denies chest pain, sob, nausea, vomiting, dizziness, cough, abdominal pain, headache.  Regarding hyperlipidemia. Controlled with diet The 10-year ASCVD risk score (Arnett DK, et al., 2019) is: 6.5%  ROS    A complete ROS was performed with pertinent positives/negatives noted in the HPI. The remainder of the ROS are negative.    Assessment/Plan:    Jared [name] was seen today for hypertension and hyperlipidemia.  Diagnoses and all orders for this visit:  Essential hypertension Chronic blood pressure at goal for risk and comorbid conditions. Target BP discussed to be < 130/80.  Continue current medications.  Recommended low-salt diet, exercise and weight loss. BP monitoring at home and keeping a BP log discussed.   Orders: -     Comprehensive Metabolic Panel; Future -     amLODIPine (NORVASC) 5 mg tablet; Take 1 tablet (5 mg total) by mouth daily.  Hyperlipidemia LDL goal <100 Chronic, diet controlled. ASCVD risk reviewed. Educated on lifestyle modifications with heart healthy diet, exercise and weight loss. Check Lipid Panel today.  Orders: -     Lipid Panel With Reflex Direct LDL; Future  Need for hepatitis C screening test -     Hepatitis C Virus (HCV)  Antibody Screen With Confirmation; Future  Need for hepatitis B screening test -     Hepatitis B Surface Antibody, Quantitative; Future  Need for influenza vaccination -     Flu,Trivalent,IM, Preservative Free (FLULAVAL, (FLUZONE(SCOT)))   Patient verbalizes understanding and in agreement with the above plan. All questions answered.  Medication side effects discussed with patient. Advised patient to call clinic or return for visit if symptoms occur. Goals of care discussed with patient including med compliance and adequate follow up.  Return in about 3 months (around 04/03/2024) for Annual physical.   Physical Exam:  Vital Signs Vitals:   01/02/24 0958  BP: 100/66  BP Location: Left arm  Patient Position: Sitting  Pulse: 90  Resp: 18  SpO2: 99%  Weight: 102 kg (223 lb 12.8 oz)  Height: 1.88 m (6' 2)    Constitutional: Well-developed and well-nourished. Sitting comfortably conversing normally. Pleasant.  Neck: Supple, No thyromegaly. No JVD. No carotid bruit.  Cardiovascular: +S1S2, regular rate and rhythm, no murmurs, gallops or rubs appreciated.  Respiratory: Normal effort, clear to auscultation bilaterally. No wheezes, rales or rhonchi noted.  Extremities: No cyanosis, clubbing or edema. Pulses 2+ all 4 extremities. Neuro: Alert and oriented x 3. Normal gait.   Skin: Skin is warm and dry. No rash noted. Psychiatric: Behavior is Cooperative and Polite. Mood euthymyic. Affect is appropriate.    Medical History    Health Maintenance Status       Date Due Completion Dates   HIV:  Serum RPR Never done ---   Meningococcal Conjugate (ACWY) Vaccine (1 - Risk 2-dose series) Never done ---   Chlamydia and Gonorrhea Screening Never done ---   Hepatitis C Screening Never done ---   Hepatitis B Vaccines (1 of 3 - 19+ 3-dose series) Never done ---   Hepatitis A Vaccines (1 of 2 - Risk 2-dose series) 07/09/1987 01/16/2018, 07/18/2017   Pneumococcal Vaccine for Ages 50+ (3 of 3 -  PCV20 or PCV21) 05/11/2020 05/12/2015, 02/03/2015   Influenza Vaccine (1) 10/18/2023 01/31/2023, 02/26/2022   Comprehensive Annual Visit 11/08/2023 11/08/2022, 08/22/2020   Diabetes Screening 11/15/2023 11/15/2022, 10/19/2021   COVID-19 Vaccine (4 - Pfizer risk 2024-25 season) 11/18/2023 01/31/2023, 02/26/2022   Depression Screening 01/01/2025 01/02/2024   DTaP/Tdap/Td Vaccines (7 - Td or Tdap) 03/01/2027 02/28/2017, 04/09/1973   Colorectal Cancer Screening 11/12/2030 11/11/2020, 11/11/2020   Adult RSV (60+ Years or Pregnancy) (1 - 1-dose 75+ series) 07/09/2043 ---        Allergies Allergies[1]  Medications Current Medications[2]  Medical history Medical History[3]  Surgical History Surgical History[4]  Social History Social History   Socioeconomic History  . Marital status: Single    Spouse name: Not on file  . Number of children: Not on file  . Years of education: Not on file  . Highest education level: Not on file  Occupational History  . Not on file  Tobacco Use  . Smoking status: Never  . Smokeless tobacco: Never  Vaping Use  . Vaping status: Never Used  Substance and Sexual Activity  . Alcohol use: Never  . Drug use: Never  . Sexual activity: Not Currently    Birth control/protection: Abstinence  Other Topics Concern  . Not on file  Social History Narrative  . Not on file   Social Drivers of Health   Food Insecurity: Low Risk  (11/01/2022)   Food vital sign   . Within the past 12 months, you worried that your food would run out before you got money to buy more: Never true   . Within the past 12 months, the food you bought just didn't last and you didn't have money to get more: Never true  Transportation Needs: No Transportation Needs (11/01/2022)   Transportation   . In the past 12 months, has lack of reliable transportation kept you from medical appointments, meetings, work or from getting things needed for daily living? : No  Safety: Low Risk  (11/01/2022)    Safety   . How often does anyone, including family and friends, physically hurt you?: Never   . How often does anyone, including family and friends, insult or talk down to you?: Never   . How often does anyone, including family and friends, threaten you with harm?: Never   . How often does anyone, including family and friends, scream or curse at you?: Never  Living Situation: Low Risk  (11/01/2022)   Living Situation   . What is your living situation today?: I have a steady place to live   . Think about the place you live. Do you have problems with any of the following? Choose all that apply:: None/None on this list    Family History Family History[5]  I have reviewed and (if needed) updated the patient's problem list, medications, allergies, past medical and surgical history, social and family history.   This document serves as a record of services personally performed by Dr. Nikki.  It was created on their behalf by Delon Macario Fairly, LPN,  a trained medical scribe, and Arts Administrator (LPN). During the course of documenting the history, physical exam and medical decision making, I was functioning as a stage manager. The creation of this record is the provider's dictation and/or activities during the visit.  Electronically signed by Delon Macario Fairly, LPN 89/83/7974 2:89 AM  I agree the documentation is accurate and complete.  Aliene Cary, MD  Note - This document was created using the aid of voice recognition Dragon dictation software.       [1] No Known Allergies [2] Current Outpatient Medications  Medication Sig Dispense Refill  . ascorbic acid (VITAMIN C) 1,000 mg tablet Vitamin C    . dolutegravir -lamivudine  (Dovato ) 50-300 mg tab per tablet Take 1 tablet by mouth daily. 90 tablet 0  . multivitamin (THERAGRAN) tab tablet Take  by mouth.    . valACYclovir  (VALTREX ) 500 mg tablet     . amLODIPine (NORVASC) 5 mg tablet Take 1 tablet (5 mg total) by mouth  daily. 90 tablet 1   No current facility-administered medications for this visit.  [3] Past Medical History: Diagnosis Date  . Cancer    (CMD) 2020   Prostate  . GERD (gastroesophageal reflux disease)   . Irregular heart beat 2004   Sometimes if feels like it flutters  . Mixed hyperlipidemia 12/14/2014  . Suspected sleep apnea 2008   Im told that I snore loudly sometimes, mouth breathing occasionally  [4] Past Surgical History: Procedure Laterality Date  . NO PAST SURGERIES     Procedure: NO PAST SURGERIES  [5] Family History Problem Relation Name Age of Onset  . Other Mother    . Diabetes Mother    . Hypertension Mother    . Heart attack Father  52  . Heart disease Maternal Grandmother    . Melanoma Maternal Grandfather    . Diabetes Paternal Grandmother    . Cancer Paternal Grandfather    . Prostate cancer Neg Hx    . Psoriasis Neg Hx    . Eczema Neg Hx

## 2024-01-27 ENCOUNTER — Ambulatory Visit: Admitting: Infectious Diseases

## 2024-01-27 ENCOUNTER — Other Ambulatory Visit (HOSPITAL_COMMUNITY)
Admission: RE | Admit: 2024-01-27 | Discharge: 2024-01-27 | Disposition: A | Discharge status: Home / Self Care | Source: Ambulatory Visit | Attending: Infectious Diseases | Admitting: Infectious Diseases

## 2024-01-27 ENCOUNTER — Encounter: Payer: Self-pay | Admitting: Infectious Diseases

## 2024-01-27 ENCOUNTER — Other Ambulatory Visit: Payer: Self-pay

## 2024-01-27 VITALS — BP 121/77 | HR 71 | Resp 19 | Ht 74.0 in | Wt 229.4 lb

## 2024-01-27 DIAGNOSIS — Z79899 Other long term (current) drug therapy: Secondary | ICD-10-CM | POA: Diagnosis not present

## 2024-01-27 DIAGNOSIS — B20 Human immunodeficiency virus [HIV] disease: Secondary | ICD-10-CM | POA: Insufficient documentation

## 2024-01-27 DIAGNOSIS — Z23 Encounter for immunization: Secondary | ICD-10-CM

## 2024-01-27 MED ORDER — DOVATO 50-300 MG PO TABS: ORAL_TABLET | ORAL | 11 refills | Status: AC

## 2024-01-27 NOTE — Progress Notes (Signed)
 Patient: Jared [name]  DOB: 13-Jun-1970 MRN: [id number] PCP: Nikki Hansel Atlas, MD  Referring Provider:  Reason for Visit: 6 month follow up   Chief Complaint  Patient presents with   Medical Management of Chronic Issues      Subjective   Subjective:   Jared [name] is a 53 year old male in clinic today for 6 month follow up. Last seen on 07/25/2023, he reports no changes to his health. He denies no recent hospitalizations or ER visits. He denies no recent sickness. Since his last visit, he was able to obtain health insurance. His current ART regiment includes daily Dovato . He reports daily adherence with some missed doses, and no adverse effects. He is able to obtain medication with no issues. Last HIV-1 RNA <20 and CD4 577. Since his last visit, he reports stable mood and denies feelings of hopelessness, sadness, jitteriness, and nervousness. He reports good sleep and has no problems staying or falling asleep. He states he is not sexually active or partners. Preventive screenings reviewed and up to date with him reporting last colonoscopy being 3-4 years ago and was normal and to follow up in 10 years. Last PSA in 11/15/2022. He is looking for dental services through his insurance portal and will book an appointment soon. Immunizations reviewed and up date, he is agreeable to COVID vaccine. He has no other concerns at this time. He denies fever, chills, night sweats, chest pain, and shortness of breath.   Review of Systems  Constitutional: Negative.   Respiratory: Negative.    Cardiovascular: Negative.   Gastrointestinal: Negative.   Genitourinary: Negative.   Skin: Negative.   Neurological: Negative.   Psychiatric/Behavioral: Negative.      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 Received daily  INH for 8.5 months Formatting of this note might be different from the original. Received daily INH for 8.5 months Received daily INH for 8.5 months Formatting of this note might be different from the original. Received daily INH for 8.5 months   Prostate cancer [facility])     Outpatient Medications Prior to Visit  Medication Sig Dispense Refill   amLODipine (NORVASC) 5 MG tablet Take 1 tablet by mouth daily.     ascorbic acid (VITAMIN C) 1000 MG tablet Vitamin C     DOVATO  50-300 MG tablet TAKE 1 TABLET BY MOUTH 1 TIME A DAY 30 tablet 4   Misc Natural Products (PUMPKIN SEED OIL) CAPS Take by mouth.     Multiple Vitamin (MULTI-VITAMIN) tablet Take by mouth.     valACYclovir  (VALTREX ) 500 MG tablet Take 1 tablet (500 mg total) by mouth 2 (two) times daily. 60 tablet 0   No facility-administered medications prior to visit.     Allergies  Allergen Reactions   No Known Allergies     Social History   Tobacco Use   Smoking status: Former   Smokeless tobacco: Never   Tobacco comments:    passive, when a kid, not habitual  Substance Use Topics   Alcohol use: Not Currently   Drug use: Not Currently    Family History  Problem Relation Age of Onset   Heart failure Mother        amyloidosis    Diabetes Mother    Hypertension Mother  Diabetes Father    Other Father        cardiac arrest    Heart failure Maternal Grandmother        pacemaker    Melanoma Maternal Grandfather    Diabetes Paternal Grandmother    Heart disease Paternal Grandmother        Objective   Objective:   Vitals:   01/27/24 1528  BP: 121/77  Pulse: 71  Resp: 19  SpO2: 98%  Weight: 229 lb 6.4 oz (104.1 kg)  Height: 6' 2 (1.88 m)   Body mass index is 29.45 kg/m.  Physical Exam Constitutional:      Appearance: Normal appearance.  Cardiovascular:     Rate and Rhythm: Normal rate and regular rhythm.  Pulmonary:     Effort: Pulmonary effort is normal.     Breath sounds: Normal breath sounds.   Musculoskeletal:        General: Normal range of motion.  Neurological:     Mental Status: He is alert and oriented to person, place, and time.  Psychiatric:        Mood and Affect: Mood normal.        Behavior: Behavior normal.        Thought Content: Thought content normal.        Judgment: Judgment normal.        Assessment & Plan:   Problem List Items Addressed This Visit       Other   Human immunodeficiency virus (HIV) disease (HCC) - Primary   Relevant Orders   Cytology - PAP   T-helper cells (CD4) count   HIV 1 RNA quant-no reflex-bld    Assessment and Plan  -Human immunodeficiency virus (HIV) disease Current regimen includes daily Dovato . He maintains daily adherence with no adverse effects and no problems accessing medication. Last HIV-1 RNA <20 and CD4 577. Will obtain up to date HIV-1 RNA and CD4 today. Continue with current regimen  -Healthcare Maintenance  Preventive care reviewed and up to date. Educated and offered anal pap screening today. He is agreeable. He is looking for dental services that are accepted through his insurance portal, will book when he finds one. Immunizations reviewed and will give COVID vaccine today.   -Hyperlipidemia  Last HDL 42. Not on medication and is diet controlled.  He was educated on increasing physical activity and increasing unsaturated fats, omgea-3 fatty acids, and fruits and vegetables.    Orders Placed This Encounter  Procedures   T-helper cells (CD4) count   HIV 1 RNA quant-no reflex-bld    No orders of the defined types were placed in this encounter.   No follow-ups on file.

## 2024-01-27 NOTE — Progress Notes (Unsigned)
 Name: Jared [name]  DOB: [birth year] MRN: [id number] PCP: Nikki Hansel Atlas, MD      Brief Narrative:  Jared [name] is a 54 y.o. male with well controlled HIV disease, Dx in [place]  in 2007.  CD4 nadir > 200.  HIV Risk: sexual (MSM/heterosexual) History of OIs: none known Intake Labs 06/2020: Hep B sAg (-), sAb (+), cAb (-); Hep A (+), Hep C (-) Quantiferon (+ --> s/p LTBI tx 2016 31m isoniazid) HLA B*5701 (-) G6PD: () Toxo IgG 2016: (-)   Previous Regimens: 2007 - Norvir, Epzicom (two other medication with this but cannot recall) Stribild  genvoya   Dovato     Genotypes: 2016: on file with [facility] but not available to see  Subjective   Chief Complaint  Patient presents with   Medical Management of Chronic Issues     Discussed the use of AI scribe software for clinical note transcription with the patient, who gave verbal consent to proceed.  History of Present Illness          Review of Systems  Constitutional:  Negative for chills, fever, malaise/fatigue and weight loss.  HENT:  Negative for sore throat.        No dental problems  Respiratory:  Negative for cough and sputum production.   Cardiovascular:  Negative for chest pain and leg swelling.  Gastrointestinal:  Negative for abdominal pain, diarrhea and vomiting.  Genitourinary:  Negative for dysuria and flank pain.  Musculoskeletal:  Negative for joint pain, myalgias and neck pain.  Skin:  Negative for rash.  Neurological:  Negative for dizziness, tingling and headaches.  Psychiatric/Behavioral:  Negative for depression and substance abuse. The patient is not nervous/anxious and does not have insomnia.     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 Received daily INH for 8.5 months Formatting of this note might be different from  the original. Received daily INH for 8.5 months Received daily INH for 8.5 months Formatting of this note might be different from the original. Received daily INH for 8.5 months   Prostate cancer [facility])     Outpatient Medications Prior to Visit  Medication Sig Dispense Refill   amLODipine (NORVASC) 5 MG tablet Take 1 tablet by mouth daily.     ascorbic acid (VITAMIN C) 1000 MG tablet Vitamin C     DOVATO  50-300 MG tablet TAKE 1 TABLET BY MOUTH 1 TIME A DAY 30 tablet 4   Misc Natural Products (PUMPKIN SEED OIL) CAPS Take by mouth.     Multiple Vitamin (MULTI-VITAMIN) tablet Take by mouth.     valACYclovir  (VALTREX ) 500 MG tablet Take 1 tablet (500 mg total) by mouth 2 (two) times daily. 60 tablet 0   No facility-administered medications prior to visit.     Allergies  Allergen Reactions   No Known Allergies     Social History   Tobacco Use   Smoking status: Former   Smokeless tobacco: Never   Tobacco comments:    passive, when a kid, not habitual  Substance Use Topics   Alcohol use: Not Currently   Drug use: Not Currently    Social History   Substance and Sexual Activity  Sexual Activity Not Currently   Partners: Male, Male       Objective   Vitals:   01/27/24 1528  BP: 121/77  Pulse: 71  Resp: 19  SpO2: 98%  Weight: 229 lb 6.4 oz (104.1 kg)  Height: 6' 2 (1.88 m)   Body mass index is 29.45 kg/m.   Physical Exam Vitals reviewed.  Constitutional:      Appearance: Normal appearance. He is not ill-appearing.  HENT:     Nose: No congestion.  Eyes:     General: No scleral icterus.    Pupils: Pupils are equal, round, and reactive to light.  Cardiovascular:     Rate and Rhythm: Normal rate and regular rhythm.     Heart sounds: No murmur heard. Pulmonary:     Effort: Pulmonary effort is normal.     Breath sounds: Normal breath sounds.  Abdominal:     General: There is no distension.     Palpations: Abdomen is soft.  Musculoskeletal:        General:  Normal range of motion.     Cervical back: Normal range of motion.  Skin:    General: Skin is warm and dry.     Capillary Refill: Capillary refill takes less than 2 seconds.  Neurological:     Mental Status: He is alert and oriented to person, place, and time.     Lab Results Lab Results  Component Value Date   WBC 4.4 07/11/2020   HGB 14.9 07/11/2020   HCT 44.7 07/11/2020   MCV 88.2 07/11/2020   PLT 249 07/11/2020    Lab Results  Component Value Date   CREATININE 1.32 (H) 08/19/2023   BUN 9 08/19/2023   NA 140 08/19/2023   K 4.1 08/19/2023   CL 104 08/19/2023   CO2 28 08/19/2023    Lab Results  Component Value Date   ALT 14 08/19/2023   AST 14 08/19/2023   BILITOT 0.5 08/19/2023    Lab Results  Component Value Date   CHOL 153 07/11/2020   HDL 58 07/11/2020   LDLCALC 76 07/11/2020   TRIG 105 07/11/2020   CHOLHDL 2.6 07/11/2020   HIV 1 RNA Quant  Date Value  08/19/2023 <20 DETECTED copies/mL (A)  01/31/2023 102 Copies/mL (H)  02/13/2021 Not Detected Copies/mL   CD4 T Cell Abs (/uL)  Date Value  08/19/2023 577  01/31/2023 453  02/26/2022 348 (L)       Assessment & Plan:       No orders of the defined types were placed in this encounter.  No orders of the defined types were placed in this encounter.       Corean Fireman, MSN, NP-C [facility] Pager: [phone] Office: [phone]  01/27/24  3:37 PM

## 2024-01-28 LAB — T-HELPER CELLS (CD4) COUNT (NOT AT ARMC)
CD4 % Helper T Cell: 41 % (ref 33–65)
CD4 T Cell Abs: 692 /uL (ref 400–1790)

## 2024-01-29 LAB — HIV-1 RNA QUANT-NO REFLEX-BLD
HIV 1 RNA Quant: NOT DETECTED {copies}/mL
HIV-1 RNA Quant, Log: NOT DETECTED {Log_copies}/mL

## 2024-01-30 ENCOUNTER — Ambulatory Visit: Payer: Self-pay | Admitting: Infectious Diseases

## 2024-01-31 LAB — CYTOLOGY - PAP
Diagnosis: NEGATIVE
Diagnosis: REACTIVE

## 2024-07-28 ENCOUNTER — Other Ambulatory Visit

## 2024-08-11 ENCOUNTER — Ambulatory Visit: Payer: Self-pay | Admitting: Infectious Diseases
